# Patient Record
Sex: Female | Born: 1960 | Race: Black or African American | Hispanic: No | Marital: Married | Smoking: Never smoker
Health system: Southern US, Community
[De-identification: ages and names within clinical notes are randomized; demographics above are authoritative.]

## PROBLEM LIST (undated history)

## (undated) DIAGNOSIS — D649 Anemia, unspecified: Secondary | ICD-10-CM

## (undated) DIAGNOSIS — G4730 Sleep apnea, unspecified: Secondary | ICD-10-CM

## (undated) DIAGNOSIS — D219 Benign neoplasm of connective and other soft tissue, unspecified: Secondary | ICD-10-CM

## (undated) DIAGNOSIS — K219 Gastro-esophageal reflux disease without esophagitis: Secondary | ICD-10-CM

## (undated) DIAGNOSIS — E669 Obesity, unspecified: Secondary | ICD-10-CM

## (undated) DIAGNOSIS — M5430 Sciatica, unspecified side: Secondary | ICD-10-CM

## (undated) DIAGNOSIS — K56609 Unspecified intestinal obstruction, unspecified as to partial versus complete obstruction: Secondary | ICD-10-CM

## (undated) DIAGNOSIS — I10 Essential (primary) hypertension: Secondary | ICD-10-CM

## (undated) DIAGNOSIS — K122 Cellulitis and abscess of mouth: Secondary | ICD-10-CM

## (undated) HISTORY — PX: ABDOMINAL HYSTERECTOMY: SHX81

## (undated) HISTORY — PX: REDUCTION MAMMAPLASTY: SUR839

## (undated) HISTORY — DX: Unspecified intestinal obstruction, unspecified as to partial versus complete obstruction: K56.609

## (undated) HISTORY — DX: Sleep apnea, unspecified: G47.30

## (undated) HISTORY — DX: Gastro-esophageal reflux disease without esophagitis: K21.9

## (undated) HISTORY — DX: Cellulitis and abscess of mouth: K12.2

## (undated) HISTORY — PX: BREAST SURGERY: SHX581

## (undated) HISTORY — PX: OTHER SURGICAL HISTORY: SHX169

## (undated) HISTORY — DX: Obesity, unspecified: E66.9

## (undated) HISTORY — DX: Benign neoplasm of connective and other soft tissue, unspecified: D21.9

## (undated) HISTORY — DX: Anemia, unspecified: D64.9

## (undated) HISTORY — DX: Essential (primary) hypertension: I10

---

## 2000-11-08 ENCOUNTER — Ambulatory Visit (HOSPITAL_BASED_OUTPATIENT_CLINIC_OR_DEPARTMENT_OTHER): Admission: RE | Admit: 2000-11-08 | Discharge: 2000-11-09 | Payer: Self-pay | Admitting: Specialist

## 2000-11-08 ENCOUNTER — Encounter (INDEPENDENT_AMBULATORY_CARE_PROVIDER_SITE_OTHER): Payer: Self-pay | Admitting: *Deleted

## 2001-06-23 ENCOUNTER — Other Ambulatory Visit: Admission: RE | Admit: 2001-06-23 | Discharge: 2001-07-05 | Payer: Self-pay | Admitting: Obstetrics and Gynecology

## 2001-06-27 ENCOUNTER — Encounter: Payer: Self-pay | Admitting: Internal Medicine

## 2001-06-27 ENCOUNTER — Ambulatory Visit (HOSPITAL_COMMUNITY): Admission: RE | Admit: 2001-06-27 | Discharge: 2001-06-27 | Payer: Self-pay | Admitting: Internal Medicine

## 2001-10-29 ENCOUNTER — Emergency Department (HOSPITAL_COMMUNITY): Admission: EM | Admit: 2001-10-29 | Discharge: 2001-10-29 | Payer: Self-pay | Admitting: Emergency Medicine

## 2001-11-01 ENCOUNTER — Encounter: Payer: Self-pay | Admitting: Emergency Medicine

## 2001-11-01 ENCOUNTER — Ambulatory Visit (HOSPITAL_COMMUNITY): Admission: RE | Admit: 2001-11-01 | Discharge: 2001-11-01 | Payer: Self-pay | Admitting: Emergency Medicine

## 2002-08-07 ENCOUNTER — Ambulatory Visit (HOSPITAL_COMMUNITY): Admission: RE | Admit: 2002-08-07 | Discharge: 2002-08-07 | Payer: Self-pay | Admitting: Internal Medicine

## 2002-08-07 ENCOUNTER — Encounter: Payer: Self-pay | Admitting: Internal Medicine

## 2003-02-07 ENCOUNTER — Ambulatory Visit (HOSPITAL_COMMUNITY): Admission: RE | Admit: 2003-02-07 | Discharge: 2003-02-07 | Payer: Self-pay | Admitting: Obstetrics and Gynecology

## 2003-02-08 ENCOUNTER — Encounter: Admission: RE | Admit: 2003-02-08 | Discharge: 2003-02-08 | Payer: Self-pay | Admitting: Obstetrics and Gynecology

## 2003-02-08 ENCOUNTER — Encounter (INDEPENDENT_AMBULATORY_CARE_PROVIDER_SITE_OTHER): Payer: Self-pay

## 2003-09-20 ENCOUNTER — Ambulatory Visit (HOSPITAL_COMMUNITY): Admission: RE | Admit: 2003-09-20 | Discharge: 2003-09-20 | Payer: Self-pay | Admitting: *Deleted

## 2003-10-27 ENCOUNTER — Emergency Department (HOSPITAL_COMMUNITY): Admission: EM | Admit: 2003-10-27 | Discharge: 2003-10-27 | Payer: Self-pay | Admitting: *Deleted

## 2003-10-29 ENCOUNTER — Ambulatory Visit (HOSPITAL_COMMUNITY): Admission: RE | Admit: 2003-10-29 | Discharge: 2003-10-29 | Payer: Self-pay | Admitting: Family Medicine

## 2003-11-05 ENCOUNTER — Ambulatory Visit (HOSPITAL_COMMUNITY): Admission: RE | Admit: 2003-11-05 | Discharge: 2003-11-05 | Payer: Self-pay | Admitting: General Surgery

## 2003-12-17 ENCOUNTER — Inpatient Hospital Stay (HOSPITAL_COMMUNITY): Admission: RE | Admit: 2003-12-17 | Discharge: 2003-12-20 | Payer: Self-pay | Admitting: Obstetrics and Gynecology

## 2004-02-06 ENCOUNTER — Ambulatory Visit: Payer: Self-pay | Admitting: Orthopedic Surgery

## 2005-03-09 ENCOUNTER — Encounter: Admission: RE | Admit: 2005-03-09 | Discharge: 2005-03-09 | Payer: Self-pay | Admitting: Obstetrics and Gynecology

## 2005-04-22 ENCOUNTER — Ambulatory Visit: Payer: Self-pay | Admitting: Orthopedic Surgery

## 2005-04-29 ENCOUNTER — Ambulatory Visit (HOSPITAL_COMMUNITY): Admission: RE | Admit: 2005-04-29 | Discharge: 2005-04-29 | Payer: Self-pay | Admitting: Orthopedic Surgery

## 2005-05-07 ENCOUNTER — Ambulatory Visit: Payer: Self-pay | Admitting: Orthopedic Surgery

## 2005-06-04 ENCOUNTER — Ambulatory Visit: Payer: Self-pay | Admitting: Orthopedic Surgery

## 2005-06-24 ENCOUNTER — Encounter: Admission: RE | Admit: 2005-06-24 | Discharge: 2005-06-24 | Payer: Self-pay | Admitting: Orthopedic Surgery

## 2005-08-28 ENCOUNTER — Encounter: Admission: RE | Admit: 2005-08-28 | Discharge: 2005-08-28 | Payer: Self-pay | Admitting: Orthopedic Surgery

## 2006-04-19 ENCOUNTER — Encounter: Admission: RE | Admit: 2006-04-19 | Discharge: 2006-04-19 | Payer: Self-pay | Admitting: Obstetrics and Gynecology

## 2006-04-26 ENCOUNTER — Ambulatory Visit: Payer: Self-pay | Admitting: Orthopedic Surgery

## 2007-04-21 ENCOUNTER — Encounter: Admission: RE | Admit: 2007-04-21 | Discharge: 2007-04-21 | Payer: Self-pay | Admitting: Obstetrics and Gynecology

## 2007-04-26 ENCOUNTER — Encounter: Admission: RE | Admit: 2007-04-26 | Discharge: 2007-04-26 | Payer: Self-pay | Admitting: Family Medicine

## 2007-07-15 ENCOUNTER — Emergency Department (HOSPITAL_COMMUNITY): Admission: EM | Admit: 2007-07-15 | Discharge: 2007-07-16 | Payer: Self-pay | Admitting: Emergency Medicine

## 2007-07-17 ENCOUNTER — Inpatient Hospital Stay (HOSPITAL_COMMUNITY): Admission: EM | Admit: 2007-07-17 | Discharge: 2007-07-27 | Payer: Self-pay | Admitting: Emergency Medicine

## 2008-09-13 ENCOUNTER — Encounter: Admission: RE | Admit: 2008-09-13 | Discharge: 2008-09-13 | Payer: Self-pay | Admitting: Obstetrics and Gynecology

## 2008-12-12 ENCOUNTER — Ambulatory Visit (HOSPITAL_COMMUNITY): Admission: RE | Admit: 2008-12-12 | Discharge: 2008-12-12 | Payer: Self-pay | Admitting: Obstetrics & Gynecology

## 2009-01-18 ENCOUNTER — Ambulatory Visit (HOSPITAL_COMMUNITY): Admission: RE | Admit: 2009-01-18 | Discharge: 2009-01-18 | Payer: Self-pay | Admitting: Obstetrics & Gynecology

## 2010-04-20 ENCOUNTER — Encounter: Payer: Self-pay | Admitting: Orthopedic Surgery

## 2010-05-28 ENCOUNTER — Other Ambulatory Visit: Payer: Self-pay | Admitting: Obstetrics and Gynecology

## 2010-05-28 DIAGNOSIS — Z1231 Encounter for screening mammogram for malignant neoplasm of breast: Secondary | ICD-10-CM

## 2010-06-20 ENCOUNTER — Ambulatory Visit
Admission: RE | Admit: 2010-06-20 | Discharge: 2010-06-20 | Disposition: A | Discharge status: Home / Self Care | Payer: PRIVATE HEALTH INSURANCE | Source: Ambulatory Visit | Attending: Obstetrics and Gynecology | Admitting: Obstetrics and Gynecology

## 2010-06-20 DIAGNOSIS — Z1231 Encounter for screening mammogram for malignant neoplasm of breast: Secondary | ICD-10-CM

## 2010-08-12 NOTE — Group Therapy Note (Signed)
NAMEWENDELL, Susan Quinn NO.:  [id number]   MEDICAL RECORD NO.:  [id number]          PATIENT TYPE:  INP   LOCATION:  A312                          FACILITY:  [facility]   PHYSICIAN:  Skeet Latch, DO    DATE OF BIRTH:  Aug 09, 1960   DATE OF PROCEDURE:  07/18/2007  DATE OF DISCHARGE:                                 PROGRESS NOTE   SUBJECTIVE:  Susan Quinn states that her abdominal pain is slightly  improved today.  Patient did have an episode of  emesis upon awakening  this morning, and briefly had an NG tube in place.  Patient states that  this NG tube irritated her throat and subsequently was pulled.  The  patient had a long discussion with surgery today.  It has been decided  that patient needs to wait a few days to see if her obstruction resolves  on its own before any intervention.  Patient seems okay with this  decision and will follow instructions.   OBJECTIVE:  VITAL SIGNS:  Temperature is 99.1.  Pulse 120.  Blood  pressure 160/93.  Respirations are 20.  CARDIAC:  She is tachycardic.  No rubs, gallops or murmurs.  LUNGS:  Clear to auscultation bilaterally.  No rales, rhonchi or  wheezing.  ABDOMEN:  Soft and mildly distended.  She has slight tenderness on deep  palpation generalized with minimal bowel sounds.  EXTREMITIES:  No clubbing, cyanosis or edema.   LABORATORY DATA:  Sodium 138, potassium 3.6, chloride 100, CO2 - 31,  glucose 136, BUN 17, creatinine 0.83.  Her white count is 6,400.  Hemoglobin 12.5, hematocrit 37.8, platelets 285.   ASSESSMENT/PLAN:  1. Small bowel obstruction.  At this time patient will be on ice      chips.  Will continue this for the next few days to see if patient      has a bowel movement or more flatulence.  Surgery continues to      follow and recommends a few days of bowel rest to see if patient      may need any intervention at this time.  We will continue to      follow.  2. Elevated blood pressure.  Patient is on  clonidine as needed.  We      will increase her dose of clonidine for her elevated blood pressure      at this time.  May need to adjust if she continues to be      tachycardic or if her blood pressure continues to be elevated.  3. Patient will continue to be on deep vein thrombosis as well as      gastrointestinal prophylaxis.      Skeet Latch, DO  Electronically Signed     SM/MEDQ  D:  07/18/2007  T:  07/18/2007  Job:  [id number]

## 2010-08-12 NOTE — Group Therapy Note (Signed)
NAMERION, SCHNITZER NO.:  [id number]   MEDICAL RECORD NO.:  [id number]          PATIENT TYPE:  INP   LOCATION:  A312                          FACILITY:  [facility]   PHYSICIAN:  Skeet Latch, DO    DATE OF BIRTH:  [birth year]   DATE OF PROCEDURE:  07/19/2007  DATE OF DISCHARGE:                                 PROGRESS NOTE   SUBJECTIVE:  Ms. Susan Quinn really has no major change in her condition  today.  The patient is still having some nausea and minimal vomiting.  She still has slight abdominal pain.  The patient was admitted with a  small bowel obstruction and continued to be followed by surgery.  Her  condition has not really improved.  We continue to follow her progress.   OBJECTIVE:  VITAL SIGNS:  Temperature 99.1 degrees, pulse 91,  respirations 20, blood pressure 152/84.  CARDIOVASCULAR:  A regular rate and rhythm.  No murmurs, rubs or  gallops.  LUNGS:  Clear bilaterally.  No rales, rhonchi or wheezing.  ABDOMEN:  Soft, slight tenderness on deep palpation.  No bowel sounds  are appreciated.  No rigidity or guarding.  EXTREMITIES:  No clubbing, cyanosis or edema.   LABORATORY DATA:  Phosphorus 2.8, magnesium 2, sodium 140, potassium  3.2, chloride 105, CO2 of 30, glucose 96, BUN 14, creatinine 0.7.  White  count 5.5, hemoglobin 10.6, hematocrit 32.1, platelet count 226.   ASSESSMENT/PLAN:  1. Partial small bowel obstruction:  The patient just had an NG tube      replaced at this time.  I will see if this improves the patient's      condition.  I had a discussion with surgery.  At this time the      patient's condition is not improving and she will probably need      surgery in the next few days.  I will continue to use conservative      management for the next day or so, and if not the patient will need      to have surgery.  2. Hypertension:  The patient's blood pressure is in a reasonable      range at this time.  Will continue to monitor.  Lastly, the  patient      will continue on GI and deep venous thrombosis prophylaxis.      Skeet Latch, DO  Electronically Signed     SM/MEDQ  D:  07/19/2007  T:  07/19/2007  Job:  [id number]

## 2010-08-12 NOTE — H&P (Signed)
NAMEAYALA, Susan NO.:  [id number]   MEDICAL RECORD NO.:  [id number]          PATIENT TYPE:  INP   LOCATION:  A312                          FACILITY:  [facility]   PHYSICIAN:  Skeet Latch, DO    DATE OF BIRTH:  01-Mar-1961   DATE OF ADMISSION:  07/17/2007  DATE OF DISCHARGE:  LH                              HISTORY & PHYSICAL   CHIEF COMPLAINT:  Abdominal pain with nausea and vomiting.   HISTORY OF PRESENT ILLNESS:  This is a 50 year old African American  female who presents with a 3-day history of abdominal cramping and  nausea.  Patient states since Friday she began to have abdominal  cramping and nausea.  Patient also states she has been having some  vomiting for the last few days and has not gotten any relief.  The  patient states that she came to the emergency room a day prior, was sent  home and then returned with the same symptoms.  The patient does not  have any major medical problems and all she takes is an over-the-counter  Prilosec for reflux.  Patient presented to the ER with the above  complaints.  A CT scan of her abdomen and pelvis done 1 day prior  showed:  1) Mildly dilated distal small bowel looped in the central  pelvis, suspicious for partial bowel obstruction, 2) A small amount of  free fluid in the pelvic cul-de-sac, 3) No focal soft tissue mass or  inflammatory processes identified.  Today she had an abdominal x-ray  performed which showed similar findings, partial small bowel  obstruction.  On exam, patient is stable and sitting in bed.   PAST MEDICAL HISTORY:  Positive for gastroesophageal reflux disease.   CURRENT HOME MEDICATIONS:  1. Prilosec OTC.  2. Phenergan 25 mg as needed.  3. Oxycodone 5/500 mg as needed.   PAST SURGICAL HISTORY:  1. Two C-sections.  2. Breast reduction.  3. Partial hysterectomy.   SOCIAL HISTORY:  1. No history of tobacco or illicit drug use.  2. She states that she drinks alcohol socially.   She  has no known drug allergies.   REVIEW OF SYSTEMS:  GENERAL:  No fever, chills, weight loss, weight  gain.  HEENT:  Unremarkable.  CARDIOVASCULAR:  No palpitations, chest  pain.  RESPIRATORY:  No cough, shortness of breath, wheezing.  GASTROINTESTINAL:  Positive for abdominal pain and cramping, nausea,  vomiting, no diarrhea noted.  GENITOURINARY:  No dysuria, urgency,  frequency.  MUSCULOSKELETAL:  No arthralgias, arthritis, myalgias.  SKIN:  No rashes, abrasions, blisters or bruising.  NEUROLOGIC:  No  lightheadedness, confusion, altered mental status changes.   PHYSICAL EXAM:  VITAL SIGNS:  Temperature is 98.1, pulse 98,  respirations 20, blood pressure 156/92, she is sating 98% on room air.  CONSTITUTIONAL:  Patient is awake and alert, oriented x3.  She is  pleasant, cooperative, no acute distress.  She is well-nourished, well-  hydrated, well-developed.  HEENT:  Head is atraumatic, normocephalic.  Eyes are PERRLA.  EOMI.  NECK:  Soft, supple, nontender, nondistended, no scleral icterus is  noted.  CARDIOVASCULAR:  Regular rate and rhythm.  No murmurs, rubs, gallops.  RESPIRATORY:  Lungs are clear to auscultation bilaterally.  No rales,  rhonchi or wheezing.  ABDOMEN:  Soft.  She has scattered tenderness on deep palpation.  She  has minimal bowel sounds, no rigidity or guarding is noted.  EXTREMITIES:  No clubbing, cyanosis or edema.   LABS:  PTT is 34, PT is 14.2, INR is 1.1.  Sodium 137, potassium 3.4,  chloride 95, CO2 is 32, glucose 137, BUN 14, creatinine 0.82, white  count is 8600.  Hemoglobin 13.1, hematocrit 40.5,  platelet count is  284,000.   RADIOLOGIC STUDIES:  Abdominal x-ray showed partial small bowel  obstruction.  Similar results on CT of her abdomen and pelvis done on  July 16, 2007.   ASSESSMENT:  1. Partial bowel obstruction.  2. History of hypertension.  3. History of gastroesophageal reflux disease.   PLAN:  1. Patient will be admitted to the service  of [facility] to general      medical bed.  2. For her partial bowel obstruction, Surgery has been consulted.      Patient will be kept n.p.o., except for medications at this time.      If patient continues to have nausea and vomiting will need an NG      tube to low intermittent suction.  At this time we will continue      patient with IV pain medication and, as stated, her n.p.o. status.  3. For her hypertension, at this time will just follow her blood      pressure and will give medication as needed.  Patient probably will      need blood pressure medication upon being discharged.  4. For her gastroesophageal reflux disease, patient will be kept on a      PPI, will change it from daily to twice daily at this time.      Patient will be placed also on DVT prophylaxis.      Skeet Latch, DO  Electronically Signed     SM/MEDQ  D:  07/17/2007  T:  07/17/2007  Job:  [id number]

## 2010-08-12 NOTE — Op Note (Signed)
NAMENEREA, BORDENAVE NO.:  [id number]   MEDICAL RECORD NO.:  [id number]          PATIENT TYPE:  INP   LOCATION:  A312                          FACILITY:  [facility]   PHYSICIAN:  Tilford Pillar, MD      DATE OF BIRTH:  Apr 11, 1960   DATE OF PROCEDURE:  07/21/2007  DATE OF DISCHARGE:  07/27/2007                               OPERATIVE REPORT   PREOPERATIVE DIAGNOSIS:  Small bowel obstruction.   POSTOPERATIVE DIAGNOSIS:  Small bowel obstruction (adhesive disease).   OPERATION:  Exploratory laparotomy with extensive lysis of adhesions.   SURGEON:  Tilford Pillar, MD   ANESTHESIA:  General endotracheal.  Local anesthetic none.   SPECIMEN:  None.   ESTIMATED BLOOD LOSS:  Less than 100 mL.   INDICATIONS:  The patient has presented to [facility]  Department with increasing nausea, vomiting, and abdominal pain.  On her  initial workup in the emergency department, it was determined that she  did have a small bowel obstruction.  At this time, she was admitted and  started on initial conservative management as her pain has resolved, her  nausea and vomiting had improved upon placement of a nasogastric tube.  She was continued to be closely monitored and treated conservatively  with IV fluid, bowel rest, serial laboratory, and physical examinations.  At this point, she continued to have diminished bowel function.  Her  nausea was intermittent.  She had pulled two NG tubes during the course  of her management, and at this point continued to have nausea, vomiting,  abdominal pain with no evidence of any bowel function, no flatus, and  still with abdominal distention.  She remained afebrile.  She had a  normal white blood cell count and no evidence of progressing  intraabdominal sepsis.  However, based on her lack of progression and  inability to resolve this conservatively it was discussed at length with  the patient and the patient's husband the risks, benefits,  and  alternatives of exploratory laparotomy with possible bowel resection,  possible ostomy.  The risks including bleeding, infection, bowel injury,  anastomotic leak, as well as persistent ileus as well as possibility of  intraoperative pulmonary or cardiac events were discussed at length with  the patient.  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  I see no evidence of  hernia noted on physical or CT evaluation, as well as no evidence of any  large obstructing masses noted on CT evaluation.  Etiologies were  discussed with the patient but again at this time it was suspected that  this is from adhesive disease.   OPERATION:  The patient was taken to the operating room.  She was placed  in the supine position on the operating table.  At this time, the  general anesthetic was administered.  Once the patient was asleep, she  was endotracheally intubated by anesthesia.  At this point, a  nasogastric tube was replaced by anesthesia and a Foley catheter was  placed using sterile technique by the operating room staff.  Her abdomen  was then prepped and draped in usual fashion.  An infraumbilical skin  incision with a right lateral keyhole defect around the umbilicus was  created with the scalpel and this dissected down to the subcuticular  tissue including the division of the fascia was carried out using  electrocautery.  At this point, the hemostats were utilized to grasp the  peritoneum with this anteriorly.  Digital palpation was conducted to  ensure no bowel was entrapped and a Metzenbaum scissor was used to  create the entry into the peritoneal cavity.  Upon entrance into the  peritoneal cavity, there was some ascitic fluid that was obtained.  This  was clear, did not appear turbid, no foul smelling odor.  At this point,  the peritoneum was further opened superiorly and inferiorly.  Several  loops of dilated small bowel were  encountered as well as multiple  adhesions to the anterior abdominal wall in the inferior aspect of the  pelvis.  This was consistent with adhesions along her previous  Pfannenstiel incision.  At this point, trocar clamps were utilized to  elevate the fascia allowing exposure of the intraabdominal wall.  Careful dissection was carried out with a combination of sharp  Metzenbaum and electrocautery dissection to free the adhesions on the  anterior abdominal wall as well as free several loops of adherent small  bowel.  This dissection continued down into the pelvis where there was  noted to be a solitary loop of small bowel pexed firmly into the pelvis.  With careful dissection, this was freed and brought up to the field.  This was clearly the transition point.  Upon further evaluation, there  was no evidence of any band adhesions around this.  It appeared that  this has just been pexed in position creating the bowel obstruction.  At  this point, an intraluminal contents were easily milked past the area of  obstruction into the distal small bowel.  At this point, I did inspect  the bowel from ligament of Treitz to the terminal ileum.  There was no  evidence of any masses.  No evidence of any ischemic portions of bowel,  and small bowel was viable and noted to have peristalsis throughout.  At  this point, I did palpate the stomach, the NG tube was well positioned.  No other masses or abnormalities were noted in the pelvis, in the right  upper or left upper quadrants on palpation.  At this point, I did  attempt to milk some of the intraluminal contents of the small bowel  proximally towards the stomach.  We did get increased return of enteric  fluid from the nasogastric tube.  At this point, the wound was irrigated  with copious amount of sterile saline.  The bowel was returned to its  intraabdominal position.  Three sheaths of adhesions permitting material  was placed into both the pelvis and  underneath the incisional wound and  then the abdomen was closed.  A 0 loop Novofil x2 was utilized to  reapproximate the fascia.  This was started inferiorly and brought to  the midportion.  A second 0 loop Novofil was started superiorly and was  brought to the first.  These were secured to each other with the tails  being tucked under the subcuticular tissue.  Wound was then again  irrigated and skin staples were utilized to reapproximate the skin  edges.  The skin was washed, dried with moist dry towel.  Telfa dressing  was placed over the staples and 4x4 dressings were placed.  The drapes  were removed.  The dressings were secured.  The patient was allowed to  come out of general anesthetic.  She was transferred back to regular  hospital bed in stable condition.  At the conclusion of the procedure,  all instrument, sponge, and needle counts were correct.  The patient  tolerated the procedure well.      Tilford Pillar, MD  Electronically Signed     BZ/MEDQ  D:  08/04/2007  T:  08/05/2007  Job:  [id number]

## 2010-08-15 NOTE — Op Note (Signed)
NAMEROCSI, HAZELBAKER NO.:  [id number]   MEDICAL RECORD NO.:  [id number]          PATIENT TYPE:  INP   LOCATION:  A425                          FACILITY:  [facility]   PHYSICIAN:  Tilda Burrow, M.D. DATE OF BIRTH:  February 07, 1961   DATE OF PROCEDURE:  DATE OF DISCHARGE:                                 OPERATIVE REPORT   PREOPERATIVE DIAGNOSIS:  Dyspareunia, scheduled for laparoscopic removal of  ovaries.   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   PROCEDURE:  Diagnostic laparoscopy, small bowel enterotomy, conversion to  laparotomy with repair of small bowel enterotomy by Dr. Lovell Sheehan, and  subsequent bilateral salpingo-oophorectomy and lysis of adhesions by Dr. Jannifer Franklin   SURGEON:  Jorene Minors.   ANESTHESIA:  General.   COMPLICATIONS:  Necessity for midline incision to fully access abdominal  cavity to perform bowel repair.   FINDINGS:  Two loops of bowel just below the umbilicus, one of which was  injured during laparoscopic entry, recognized immediately. Extensive pelvic  adhesions with large dilated and tortuous sigmoid colon.   DETAILS OF PROCEDURE:  The patient was taken to the operating room, prepped  and draped in the usual fashion for combined abdominal and vaginal procedure  with legs supported in the yellow-fin low lithotomy leg supports and Foley  catheter in place. The abdomen was prepped and draped, and an infraumbilical  vertical 1-cm skin incision performed through the skin. The patient was  noted to have several upper abdomen puncture sites from prior laparoscopies,  and we were careful to avoid going through an old scar. Veress needle was  used to introduce through the umbilicus, being careful to elevate the  abdominal activity and point the Veress needle towards the pelvis. Water  droplet technique was used to confirm  intraperitoneal location, and  pneumoperitoneum achieved under 8 mmHg 2 to 3 liters CO2 instilled.  Laparoscopic 10-mm camera directed. Trocar was then used to enter the  abdominal cavity. The laparoscope was oriented toward the pelvis and passed  in the peritoneal fat for a distance of approximately 5 cm and was not able  to enter the abdominal cavity on the very first attempt but was reoriented  toward the pelvis and used with laparoscopic guidance and entered what  appeared to be abdomen until the trocar sleeve was emptied and the camera  reinserted. It was apparent that we had perforated into the loop of small  bowel. This was promptly recognized on the very start of the case. We  therefore left the trocar sleeve in place with the camera and prepped for  conversion for laparotomy. Decision was made to proceed with a midline  vertical incision. We made a vertical incision, removing a small ellipse of  redundant skin. We entered through the abdomen. By this time, Dr. Lovell Sheehan  was there having been requested for consultation and assistance. We then  entered the abdominal cavity carefully, removed the laparoscopic trocar,  identified the small bowel injury, and were able to mobilize the small bowel  from its attachments which were quite dense, to the subumbilical area. There  were only 2 loops of bowel parallel to one another in the area below the  umbilicus. There was relatively easy mobilization of the bowel. The lower  pelvis had only some small thin adhesions of omentum. The pelvis itself was  quite adherent, particularly the left adnexa where the sigmoid was densely  adherent against the left pelvic side wall. We proceeded with mobilization  of the small bowel loops, separating them all and then inspecting carefully  to ensure that no through and through injury had occurred. None was  identified. This was consistent with the initial laparoscopic findings. We  were able to close the  bowel in 2 layer closure using 2-0 silk sutures  interrupted fashion placed in 2 layer closure. The pelvis was copiously  irrigated with antibiotic solution including gentamicin containing solution.  The small bowel was mobilized with some thin filmy adhesions encountered at  various spots. The entire bowel could be run. It was noted that there was a  significant amount of omental adhesions into the left lower quadrant. After  Dr. Lovell Sheehan had completed his peritoneal closure, we irrigated the abdomen  carefully, inspected the pelvis, and confirmed that there was relatively  clean pelvis other than the obscured left adnexa. Dr. Lovell Sheehan proceeded with  his own previously planned surgery thereafter.   The procedure was assisted by Kerry Hough, CST-FA. We proceeded to march  down along identifiable cleavage planes, carefully immobilizing the  rectosigmoid from the left pelvic side wall. There were extensive old long  standing adhesions holding the bowel from the side wall. These were easily  taken down once we confined satisfactory cleavage planes. We marched down  the side wall, being careful not to go retroperitoneal and to not injure or  denude the bowel. We eventually identified both adnexal structures, having  pulled away filmy adhesions from both. Once this was done, we were able to  perform bilateral salpingo-oophorectomy.   Right salpingo-oophorectomy was performed by grasping a readily identifiable  quiescent right ovary with Babcock clamp, mobilizing any small adhesions  from the right adnexa and gradually  freeing up the adnexal structures.  These could be dissected off the side wall with careful identification of  the right ureter, and we were able to take off the right tube and ovary with  identifying and isolating the infundibulopelvic ligament well away from the  ureter, cross clamping, and then peeling the ovary from its imbedded  position in the right pelvic side  wall.  Then we proceeded with the left salpingo-oophorectomy which first required  extensive dissection from the sigmoid to mobilize it off of the left pelvic  side wall. Distal sigmoid was also attached to the top of the vaginal cuff,  and it was felt that the bowel adhesions were a significant complicating  factor of the patient's long standing chronic constipation. We were able to  gradually mobilize the rectosigmoid and identify thin quiescent ovary stuck  to the side of the pelvis and the sigmoid. The colon was taken down from its  adhesions over the left side of the pelvis including vaginal apex. We were  able to identify the ureter down the left side wall and identify the left  infundibulopelvic ligament separate and distinct from the left ureter. We  had to trace the left ureter down all the way along the side wall to be sure  that there was adequate access. We were able  to take off the left tube and  ovary without compromising the ureter, though we were within 5 mm at times  in our dissection.   Right adnexa was similarly taken care of. Then we proceeded to irrigate the  pelvis copiously, inspect for any adhesions, free up the bowel completely so  that relaxed repositioning of the bowel could occur. We then proceeded with  irrigation of the abdomen, closure of the anterior peritoneum with 2-0  chromic, placement of 0 Vicryl closure of the fascia, placement of subcu  Jackson-Pratt drain, and 2-0 plain closure of the subcu fatty tissue and  staple closure of the skin with estimated blood loss 150 cc. The patient  tolerated the procedure well and went to recovery room in good condition.     John   JVF/MEDQ  D:  12/17/2003  T:  12/18/2003  Job:  [id number]

## 2010-08-15 NOTE — H&P (Signed)
NAMESHARLENA, KRISTENSEN NO.:  [id number]   MEDICAL RECORD NO.:  [id number]          PATIENT TYPE:  AMB   LOCATION:  DAY                           FACILITY:  [facility]   PHYSICIAN:  Tilda Burrow, M.D. DATE OF BIRTH:  07/08/1960   DATE OF ADMISSION:  12/17/2003  DATE OF DISCHARGE:  LH                                HISTORY & PHYSICAL   ADMITTING DIAGNOSES:  Uterine fibroids 12 weeks size, menorrhagia, anemia  secondary to menorrhagia, obesity with abdominal wall laxity requesting  panniculectomy.   HISTORY OF PRESENT ILLNESS:  This 50 year old female has been followed  through our office for various GYN concerns since December 2004.  She is  admitted at this time for abdominal hysterectomy and panniculectomy with  planned preservation of ovaries, with planned removal of cervix.  Liliauna  has been seen in our office with documented anemia with hematocrit of 27.5  despite efforts at iron therapy.  She is normally cared for by Dr. Assunta Found of [facility].  Preoperative evaluations included  evaluation for chest discomfort which has included an echocardiogram which  was normal with ejection fraction of 61%.  She had exercise intolerance  suggesting deconditioning.  Ms. Link requests panniculectomy as a portion  of the procedure, this has been discussed with her and she has been referred  to plastic surgeon, this would be not an option for the patient due to cost  for an uncovered service.  She requests panniculectomy as a portion of the  procedure.   Hysterectomy is to consist of removal of uterus estimated approximately 300  g size based on ultrasound measurements of 10.9 x 6.5 x 8.4 cm with numerous  fibroids including lower uterine segment fibroid noted as well as a  pedunculated left uterine fundus fibroid.  Risks of surgery have been  reviewed with patient including increased risk related with increased  incision size.  She is aware that  drains are planned and will be left in  place after she is discharged home for time frame to be determined based on  clinical course.   PAST MEDICAL HISTORY:  Positive for gastroesophageal reflux.  She has had a  cardiac workup in August 2005 by Dr. Kem Boroughs documenting improvement  in symptoms when being placed on Aciphex and a negative cardiac echo with  61% ejection fraction.   PAST SURGICAL HISTORY:  Surgical history notable for reduction mammoplasty  many years ago in [place], patient has had suboptimal results from her  reduction mammoplasty due to what is supposedly shifting in the residual  tissue to result in atypical nipple placement.  C-section.   ALLERGIES:  None.   MEDICATIONS:  Aciphex, iron tablets earlier this Fall, Vicodin p.r.n. pelvic  discomfort.   PHYSICAL EXAMINATION:  GENERAL:  Healthy cheerful African-American female.  VITAL SIGNS:  Height 5 feet 5 inches, weight 237.  EYES:  Pupils equal, round, reactive.  NECK:  Neck supple.  Trachea midline.  CHEST:  Clear to auscultation.  CARDIAC:  Regular rate and rhythm without murmurs.  BREAST:  Well-healed surgical scars with evidence of  prior reduction  mammoplasty.  ABDOMEN:  Moderate obesity with significant protuberance of the fatty tissue  below the umbilicus.  She has a well-healed surgical scar from prior  cesarean section.  GU:  Pelvic exam shows good support.  Cervix is normal.  Uterus is  anteflexed.  Adnexa negative for masses.  GC and Chlamydia cultures  negative.  Prior Pap smears through [facility].  EXTREMITIES:  Grossly normal without cyanosis, clubbing, or edema.   PLAN:  Abdominal hysterectomy/panniculectomy on December 17, 2003.     John   JVF/MEDQ  D:  12/16/2003  T:  12/16/2003  Job:  [id number]   cc:   [facility]   [facility]  Fax: [phone]

## 2010-08-15 NOTE — Discharge Summary (Signed)
Susan Quinn, ATAYDE NO.:  [id number]   MEDICAL RECORD NO.:  [id number]          PATIENT TYPE:  INP   LOCATION:  A312                          FACILITY:  [facility]   PHYSICIAN:  Tilford Pillar, MD      DATE OF BIRTH:  [birth year]   DATE OF ADMISSION:  07/17/2007  DATE OF DISCHARGE:  04/29/2009LH                               DISCHARGE SUMMARY   ADMISSION DIAGNOSIS:  Small bowel obstruction.   DISCHARGE DIAGNOSIS:  Status post lysis of adhesions.   PROCEDURES:  Exploratory laparotomy with lysis of adhesions on July 21, 2007.   ADMITTING SURGEON:  Tilford Pillar, MD   DISPOSITION:  Home.   BRIEF H&P:  Please see the admission history and physical for the  complete H&P.  The patient presented to [facility] Emergency  Department with increasing nausea, vomiting, and abdominal pain.  She  was initially evaluated and was found to have small bowel obstruction.  She was admitted on July 17, 2007 for continued management and  intervention.   HOSPITAL COURSE:  The patient was admitted as above.  Initial attempts  at conservative managements were unsuccessful and on July 21, 2007, the  patient continued to have no bowel function, continued to have nausea  and vomiting despite adequate IV resuscitation and nasogastric tube  compression.  Bowel function did not return at this point, so she was  taken to the operating room for exploratory laparotomy and lysis of  adhesions.  She tolerated the procedure well.  There was a single-loop  transition point noted on the small bowel.  There was no evidence of any  band adhesions around this portion of the bowel.  No resection was  required.  She tolerated the procedure well.  Postoperative course was  somewhat slow secondarily to a persistent postoperative ileus where she  did slowly begin to have flatus, increase in bowel function, and  diminishing nausea and vomiting.  She did have some low-grade  temperatures  associated with this, had atelectasis with initial limited  mobility, however, with increased activity and encouragement of  incentive spirometry usage, this quickly improved.  On July 27, 2007,  the patient was tolerating a regular diet.  She was ambulatory.  The  pain was controlled on oral pain medicine.  Plans were made for  discharge to home.   DISCHARGE INSTRUCTIONS:  The patient is to continue local wound care.  She is to continue activity as tolerated.  She is to not lift anything  greater than 20 pounds for the next 4 weeks.  She may resume the diet as  tolerated.  She is to follow up in my office in 2 weeks for staple  removal.   DISCHARGE MEDICATIONS:  1. The patient is to continue all previously prescribed home      medications.  2. Lortab 5/500 mg 1-2 p.o. q.4 h. p.r.n. pain.      Tilford Pillar, MD  Electronically Signed     BZ/MEDQ  D:  08/04/2007  T:  08/05/2007  Job:  [id number]

## 2010-08-15 NOTE — Op Note (Signed)
[place]. [facility]  Patient:    Susan Quinn, Susan Quinn              MRN: [id number] Proc. Date: 11/08/00 Adm. Date:  [id number] Attending:  Gustavus Messing                           Operative Report  INDICATION:  This is a 50 year old lady with severe, severe macromastia, back and shoulder pain secondary to her large pendulous breasts.  She has also increased accessory breast tissue, the right side greater than the left, with tissue traversing around the right axilla, right latissimus dorsi area and serratus anterior areas.  PROCEDURES PLANNED:  Bilateral breast reductions with the excision of accessory breast tissue using the inferior pedicle technique.  SURGEON:  Aundra Dubin, M.D.  ASSISTANT:  Margaretha Sheffield, R.N.  DESCRIPTION OF PROCEDURE:  Preoperatively, the patient was sat up and drawn for the reduction mammoplasty using the inferior pedicle technique.  She then underwent general anesthesia and intubated orally.  Prep was done to the chest and breast areas in the routine fashion using Betadine soaping solution and walled off with sterile towels and draped so as to make a sterile field.  The areas were injected with Xylocaine 0.25% with epinephrine 1:400,000 concentration, a total of 100 cc per side.  The wounds were scored with #15 blades and the skin over the inferior pedicles was de-epithelialized with the #20 blades.  The medial and lateral fatty dermal pedicles were incised down to underlying pectoralis fascia and excised out.  Next, after proper hemostasis, the new keyhole areas were also debulked.  Accessory breast tissue was removed from the latissimus dorsi area, serratus anterior and upper axillary regions using direct excision as well as liposuction assistance with [place] catheters 28-3 and -4s.  After this was completed, the new keyhole areas were debulked.  Flaps were transposed and stayed with 3-0 Prolene. Subcutaneous  closure was done with 3-0 Monocryl x 2 layers and running subcuticular stitches of 3-0 Monocryl to the areas.  The wounds were cleansed, sterile dressings applied including Xeroform, 4 x 4s, Steri-Strips, ABDs and Hypafix.  She withstood the procedures very well.  Estimated blood loss was less than 100 cc.  Complications:  None. DD:  11/08/00 TD:  11/08/00 Job: [id number]

## 2010-08-15 NOTE — Op Note (Signed)
Susan Quinn, Susan Quinn NO.:  [id number]   MEDICAL RECORD NO.:  [id number]          PATIENT TYPE:  INP   LOCATION:  A425                          FACILITY:  [facility]   PHYSICIAN:  Tilda Burrow, M.D. DATE OF BIRTH:  July 19, 1960   DATE OF PROCEDURE:  12/17/2003  DATE OF DISCHARGE:  12/20/2003                                 OPERATIVE REPORT   PREOPERATIVE DIAGNOSES:  1.  Uterine fibroids.  2.  Menorrhagia.  3.  Anemia.  4.  Obesity with panniculus.   POSTOPERATIVE DIAGNOSES:  1.  Uterine fibroids.  2.  Menorrhagia.  3.  Anemia.  4.  Obesity with panniculus.   PROCEDURE:  Total abdominal hysterectomy, lysis of adhesions,  panniculectomy.   SURGEON:  Tilda Burrow, M.D.   ASSISTANT:  Talbert Forest.   ANESTHESIA:  General.   COMPLICATIONS:  None.   ESTIMATED BLOOD LOSS:  500 cc.   FINDINGS:  Dense adhesions from uterine fundus to left lower quadrant of  abdomen.  Obesity with thick subcutaneous fatty space and abdominal wall  laxity.   DRAINS:  Two subcutaneous drain, one drain to the space of Retzius.   SPECIMENS:  Uterus, cervix, abdominal pannus.   OPERATIVE TIME:  2 hours and 24 minutes.   DESCRIPTION OF PROCEDURE:  The patient was taken to the operating room and  prepped and draped for lower abdominal surgery, with vaginal prep performed  and Foley catheter in place. The lower abdomen was marked as previously  agreed to by the patient, removing a 60-cm long x 10-cm wide ellipse of skin  and subcutaneous fatty tissue.  The lateral aspects were reapproximated with  2-0 plain and stapled closed, leaving the middle 30 cm open.   We then made a Pelosi-type incision with midline incision from just below  the umbilicus to the suprapubic area, easily entering the abdominal cavity.  There were some omental adhesions to the anterior abdominal wall, and these  were taken down carefully.  The uterus was found to have some adhesions to  the anterior  abdominal wall.  The round ligaments could be identified and  were taken down bilaterally.  There was a pedunculated posterior fibroid on  the right side.  The ovaries appeared grossly normal on either side.  There  was a cyst of the left ovary that was ruptured spontaneously during  manipulation.   The left utero-ovarian ligament was isolated, clamped, cut, and suture  ligated, leaving the ovary in place.  The right side was treated similarly.  The dense fundal adhesions to the anterior abdominal wall required extensive  careful dissection to remove this without getting into the bladder.  We  gradually were able to take these down and ligate it across the adhesion  stump on both sides.  Once the fundal adhesions were taken down, the bladder  flap area was identified and was much easier to deal with, and the bladder  was taken down.  We them marched down the sides, clamping, cutting, and  suture ligating with 0 chromic, marching down the cardinal ligaments on  either side, reaching the  vaginal cuff.  An anterior stab incision was made  in the cervicovaginal fornix.  We amputated the cervix and uterus off of the  vaginal cuff.  We closed the vaginal cuff with Aldridge stitches at each  lateral vaginal angle and then figure-of-eight sutures to close the vaginal  cuff.  The patient tolerated this portion of the procedure quite well.  Irrigation was performed.  The patient had diffuse oozing from the area  where all the adhesions had been attached to the bladder, but at no point  were we suspicious of bladder perforation.  Point cautery was used when it  could be safely performed.  A few Endoclips were necessary.  At the end of  the surgical procedure, hemostasis was deemed as adequate.  The pelvis was  irrigated.  A drain was placed in the space of Retzius with the tip of it  reaching into the pelvis, and then closure began.  The peritoneum was closed  as much as possible with 2-0 chromic,  and then the midline incision was  closed with Monocryl.  The subcutaneous tissues were reapproximated with  interrupted 2-0 plain sutures.  Two flat JP drains were allowed to exit  above the incision near the midline, and staple closure of the skin  completed the procedure.  Estimated blood loss was 500 cc.     John   JVF/MEDQ  D:  12/20/2003  T:  12/20/2003  Job:  [id number]

## 2010-08-15 NOTE — Discharge Summary (Signed)
NAMETALLYN, HOLROYD NO.:  [id number]   MEDICAL RECORD NO.:  [id number]          PATIENT TYPE:  INP   LOCATION:  A425                          FACILITY:  [facility]   PHYSICIAN:  Tilda Burrow, M.D. DATE OF BIRTH:  06-27-1960   DATE OF ADMISSION:  12/17/2003  DATE OF DISCHARGE:  09/22/2005LH                                 DISCHARGE SUMMARY   ADMISSION DIAGNOSES:  1.  Uterine fibroids, 12 weeks size.  2.  Menorrhagia.  3.  Anemia.  4.  Abdominal wall obesity with laxity and requested panniculectomy.   DISCHARGE DIAGNOSIS:  1.  Uterine fibroids, 12 weeks size.  2.  Menorrhagia.  3.  Anemia.  4.  Obesity with abdominal wall laxity.   PROCEDURE:  Total abdominal hysterectomy, lysis of adhesions, and  panniculectomy on December 17, 2003.   DISCHARGE MEDICATIONS:  1.  Tylox one to two p.o. q.4h. p.r.n. pain, dispense 30.  2.  Motrin 400 mg p.o. q.4h. p.r.n. mild pain.  3.  Cipro 500 mg b.i.d. x5 days.   FOLLOW UP:  One week for drain removal and incision check.   HISTORY OF PRESENT ILLNESS:  This 50 year old female was admitted for a  hysterectomy and panniculectomy after referral from [facility].  She had an estimated 300 g sized uterus, had chronic anemia  secondary to heavy menstrual flow.  She had a cardiac workup in August 2005,  with 61% ejection fraction and chest discomfort found to be related to GERD.   HOSPITAL COURSE:  The patient was admitted, underwent hysterectomy and  panniculectomy, removing an approximately 300 g uterus and a 60 cm long x 10  cm wide ellipse of skin and some fatty tissue.  The procedure was notable  for some extensive adhesions with dense adhesions as large as 1 cm in  diameter attaching the uterus fundus to the anterior abdominal wall, coming  from the old cesarean section scars.  She had two subcutaneous drains, and a  drain to the space of Retzius,due to persistent oozing.   Hematocrit remained nicely  stable.  She had a hematocrit of 29  preoperatively, 30 in the postoperative, and equilibrated to 26.1 on  postoperative day #1.  She had an uneventful postoperative course and was  discharged on Thursday, December 20, 2003, postoperative day #3, with above  listed medications for followup in one week.     John   JVF/MEDQ  D:  12/20/2003  T:  12/20/2003  Job:  [id number]   cc:   [facility]   Corrie Mckusick, M.D.  Fax: [phone]

## 2010-12-23 LAB — COMPREHENSIVE METABOLIC PANEL
ALT: 18
AST: 16
Albumin: 3.8
CO2: 32
Calcium: 10.1
Chloride: 95 — ABNORMAL LOW
Creatinine, Ser: 0.82
GFR calc Af Amer: >60
GFR calc non Af Amer: >60
Sodium: 137
Total Bilirubin: 0.6

## 2010-12-23 LAB — BASIC METABOLIC PANEL
BUN: 10
BUN: 14
BUN: 7
BUN: 8
BUN: 8
CO2: 28
CO2: 28
CO2: 30
CO2: 30
CO2: 31
Calcium: 8.4
Calcium: 8.4
Calcium: 8.6
Calcium: 8.8
Calcium: 8.8
Calcium: 8.9
Chloride: 100
Chloride: 101
Chloride: 101
Chloride: 93 — ABNORMAL LOW
Chloride: 97
Creatinine, Ser: 0.63
Creatinine, Ser: 0.64
Creatinine, Ser: 0.7
Creatinine, Ser: 0.72
Creatinine, Ser: 0.72
GFR calc Af Amer: >60
GFR calc Af Amer: >60
GFR calc Af Amer: >60
GFR calc Af Amer: >60
GFR calc Af Amer: >60
GFR calc non Af Amer: >60
GFR calc non Af Amer: >60
GFR calc non Af Amer: >60
Glucose, Bld: 105 — ABNORMAL HIGH
Glucose, Bld: 110 — ABNORMAL HIGH
Glucose, Bld: 115 — ABNORMAL HIGH
Glucose, Bld: 126 — ABNORMAL HIGH
Glucose, Bld: 136 — ABNORMAL HIGH
Glucose, Bld: 96
Potassium: 3.2 — ABNORMAL LOW
Potassium: 3.3 — ABNORMAL LOW
Potassium: 3.8
Sodium: 133 — ABNORMAL LOW
Sodium: 135
Sodium: 136
Sodium: 137
Sodium: 138
Sodium: 140

## 2010-12-23 LAB — CBC
HCT: 30.3 — ABNORMAL LOW
HCT: 32.1 — ABNORMAL LOW
HCT: 33.6 — ABNORMAL LOW
HCT: 35 — ABNORMAL LOW
HCT: 35.2 — ABNORMAL LOW
HCT: 37.8
Hemoglobin: 11.2 — ABNORMAL LOW
Hemoglobin: 11.6 — ABNORMAL LOW
Hemoglobin: 12.5
MCHC: 32.8
MCHC: 32.9
MCHC: 33.3
MCHC: 33.4
MCHC: 33.5
MCV: 71.5 — ABNORMAL LOW
MCV: 71.8 — ABNORMAL LOW
MCV: 71.9 — ABNORMAL LOW
MCV: 71.9 — ABNORMAL LOW
MCV: 72.5 — ABNORMAL LOW
Platelets: 215
Platelets: 221
Platelets: 226
Platelets: 284
Platelets: 299
Platelets: 325
RBC: 4.21
RBC: 4.34
RBC: 4.5
RBC: 4.86
RBC: 5.22 — ABNORMAL HIGH
RBC: 5.65 — ABNORMAL HIGH
RDW: 14.8
RDW: 15
RDW: 15.1
RDW: 15.2
RDW: 15.2
RDW: 15.2
RDW: 15.4
WBC: 2.7 — ABNORMAL LOW
WBC: 4
WBC: 5.3
WBC: 8.6

## 2010-12-23 LAB — DIFFERENTIAL
Basophils Absolute: 0
Basophils Absolute: 0
Basophils Absolute: 0
Basophils Absolute: 0
Basophils Absolute: 0
Basophils Absolute: 0
Basophils Absolute: 0
Basophils Absolute: 0
Basophils Relative: 0
Basophils Relative: 0
Basophils Relative: 0
Basophils Relative: 0
Basophils Relative: 0
Basophils Relative: 0
Eosinophils Absolute: 0
Eosinophils Absolute: 0
Eosinophils Absolute: 0
Eosinophils Absolute: 0
Eosinophils Absolute: 0
Eosinophils Absolute: 0
Eosinophils Absolute: 0.1
Eosinophils Absolute: 0.1
Eosinophils Absolute: 0.1
Eosinophils Relative: 0
Eosinophils Relative: 0
Eosinophils Relative: 1
Eosinophils Relative: 1
Eosinophils Relative: 1
Lymphocytes Relative: 15
Lymphocytes Relative: 17
Lymphocytes Relative: 18
Lymphocytes Relative: 23
Lymphocytes Relative: 8 — ABNORMAL LOW
Lymphs Abs: 0.6 — ABNORMAL LOW
Lymphs Abs: 0.7
Lymphs Abs: 1.4
Lymphs Abs: 1.5
Monocytes Absolute: 0.3
Monocytes Absolute: 0.4
Monocytes Absolute: 0.4
Monocytes Absolute: 0.5
Monocytes Absolute: 0.6
Monocytes Relative: 12
Monocytes Relative: 13 — ABNORMAL HIGH
Monocytes Relative: 3
Monocytes Relative: 7
Monocytes Relative: 8
Neutro Abs: 1.8
Neutro Abs: 2.2
Neutro Abs: 2.9
Neutro Abs: 3
Neutro Abs: 3.1
Neutro Abs: 4.3
Neutrophils Relative %: 65
Neutrophils Relative %: 66
Neutrophils Relative %: 72
Neutrophils Relative %: 74
Neutrophils Relative %: 74

## 2010-12-23 LAB — HEPATIC FUNCTION PANEL
Bilirubin, Direct: <0.1
Total Bilirubin: 0.6

## 2010-12-23 LAB — URINALYSIS, ROUTINE W REFLEX MICROSCOPIC
Glucose, UA: NEGATIVE
Glucose, UA: NEGATIVE
Hgb urine dipstick: NEGATIVE
Leukocytes, UA: NEGATIVE
Nitrite: NEGATIVE
Protein, ur: 30 — AB
Protein, ur: NEGATIVE
Specific Gravity, Urine: 1.02
Urobilinogen, UA: 0.2
pH: 6

## 2010-12-23 LAB — PHOSPHORUS
Phosphorus: 2.8
Phosphorus: 2.9
Phosphorus: 3.3
Phosphorus: 3.4

## 2010-12-23 LAB — URINE CULTURE: Colony Count: 30000

## 2010-12-23 LAB — MAGNESIUM
Magnesium: 2
Magnesium: 2
Magnesium: 2.2

## 2010-12-23 LAB — PROTIME-INR: Prothrombin Time: 14.2

## 2010-12-23 LAB — URINE MICROSCOPIC-ADD ON

## 2011-06-29 ENCOUNTER — Other Ambulatory Visit: Payer: Self-pay | Admitting: Obstetrics and Gynecology

## 2011-06-29 DIAGNOSIS — Z1231 Encounter for screening mammogram for malignant neoplasm of breast: Secondary | ICD-10-CM

## 2011-06-30 ENCOUNTER — Ambulatory Visit: Payer: PRIVATE HEALTH INSURANCE

## 2011-07-06 ENCOUNTER — Ambulatory Visit
Admission: RE | Admit: 2011-07-06 | Discharge: 2011-07-06 | Disposition: A | Discharge status: Home / Self Care | Payer: PRIVATE HEALTH INSURANCE | Source: Ambulatory Visit | Attending: Obstetrics and Gynecology | Admitting: Obstetrics and Gynecology

## 2011-07-06 DIAGNOSIS — Z1231 Encounter for screening mammogram for malignant neoplasm of breast: Secondary | ICD-10-CM

## 2012-03-30 DIAGNOSIS — K122 Cellulitis and abscess of mouth: Secondary | ICD-10-CM

## 2012-03-30 HISTORY — DX: Cellulitis and abscess of mouth: K12.2

## 2012-06-30 ENCOUNTER — Ambulatory Visit (INDEPENDENT_AMBULATORY_CARE_PROVIDER_SITE_OTHER): Payer: PRIVATE HEALTH INSURANCE | Admitting: Adult Health

## 2012-06-30 ENCOUNTER — Telehealth: Payer: Self-pay | Admitting: Adult Health

## 2012-06-30 ENCOUNTER — Encounter: Payer: Self-pay | Admitting: Adult Health

## 2012-06-30 VITALS — BP 122/86 | HR 80 | Ht 65.0 in | Wt 256.0 lb

## 2012-06-30 DIAGNOSIS — I10 Essential (primary) hypertension: Secondary | ICD-10-CM | POA: Insufficient documentation

## 2012-06-30 DIAGNOSIS — IMO0001 Reserved for inherently not codable concepts without codable children: Secondary | ICD-10-CM

## 2012-06-30 DIAGNOSIS — K05219 Aggressive periodontitis, localized, unspecified severity: Secondary | ICD-10-CM

## 2012-06-30 DIAGNOSIS — K0520 Aggressive periodontitis, unspecified: Secondary | ICD-10-CM

## 2012-06-30 DIAGNOSIS — E669 Obesity, unspecified: Secondary | ICD-10-CM

## 2012-06-30 DIAGNOSIS — K219 Gastro-esophageal reflux disease without esophagitis: Secondary | ICD-10-CM

## 2012-06-30 HISTORY — DX: Obesity, unspecified: E66.9

## 2012-06-30 HISTORY — DX: Reserved for inherently not codable concepts without codable children: IMO0001

## 2012-06-30 MED ORDER — AMOXICILLIN 500 MG PO CAPS: 500.0000 mg | ORAL_CAPSULE | Freq: Three times a day (TID) | ORAL | Status: DC

## 2012-06-30 NOTE — Progress Notes (Signed)
Subjective:     Patient ID: Susan Quinn, female   DOB: 25-Sep-1960, 52 y.o.   MRN: [id number]  HPI Susan Quinn is a 52 year old black female, in to have her blood pressure checked she said it was 160/90 at work this am. She is having numbness in top gum and her cheek hurts, she has noticed some swelling in her ankles.   Review of SystemsCarolyn denies any headache or blurred vision. She is complaining of numbness of her top gum and right cheek hurts.She complains of swelling in her ankles, she is having some nite sweats and complains of weight gain. Reviewed past medical, surgical, social and family history in epic. Reviewed medications and allergies.     Objective:   Physical ExamSkin warm and dry. No maxillary, frontal or ethmoid sinus tenderness. No oral lesions, nasal passages are normal. She does exhibit tenderness to the right of her right nares, and there is tenderness on the top gum above her teeth. She recently had an x-ray of her teeth. No recent oral procedures. Cardiovascular: regular rate and rhythm. Lungs: clear auscultation bilaterally. Extremities: Mild swelling non pitting,nontender with good pedal pulses. Dr. Despina Quinn in to co-examine gum    Assessment:     Hypertension Abscess of upper gum   Obesity  Plan:      Rx. Amoxicillin 500mg  tid x 10 days Decrease salt and try lemon water Continue HCTZ and check BP at work next week Call dentist Number given to Dr. Johna Quinn for her to call and discuss Gastric sleeve surgery as she desires weight loss.

## 2012-06-30 NOTE — Patient Instructions (Addendum)
Take amoxicillin as directed. Call dentist Decrease salt, try lemon water RTC in 2 weeks in follow up Sign for my chart Check BP at week and record

## 2012-06-30 NOTE — Telephone Encounter (Signed)
Pt. Complains of BP 160/90 sinus symptoms, facial sore, gums feel numb, and has crawling sensation also swelling in feet and legs. To come in at 4pm today.

## 2012-07-13 ENCOUNTER — Encounter: Payer: Self-pay | Admitting: *Deleted

## 2012-07-14 ENCOUNTER — Ambulatory Visit: Payer: PRIVATE HEALTH INSURANCE | Admitting: Adult Health

## 2012-07-25 ENCOUNTER — Ambulatory Visit (INDEPENDENT_AMBULATORY_CARE_PROVIDER_SITE_OTHER): Payer: PRIVATE HEALTH INSURANCE | Admitting: Adult Health

## 2012-07-25 ENCOUNTER — Encounter: Payer: Self-pay | Admitting: Adult Health

## 2012-07-25 VITALS — BP 126/72 | HR 76 | Ht 64.0 in | Wt 256.0 lb

## 2012-07-25 DIAGNOSIS — Z01419 Encounter for gynecological examination (general) (routine) without abnormal findings: Secondary | ICD-10-CM

## 2012-07-25 DIAGNOSIS — Z1212 Encounter for screening for malignant neoplasm of rectum: Secondary | ICD-10-CM

## 2012-07-25 DIAGNOSIS — E669 Obesity, unspecified: Secondary | ICD-10-CM

## 2012-07-25 DIAGNOSIS — I10 Essential (primary) hypertension: Secondary | ICD-10-CM

## 2012-07-25 LAB — HEMOCCULT GUIAC POC 1CARD (OFFICE): Fecal Occult Blood, POC: NEGATIVE

## 2012-07-25 MED ORDER — PHENTERMINE HCL 37.5 MG PO CAPS: 37.5000 mg | ORAL_CAPSULE | ORAL | Status: DC

## 2012-07-25 NOTE — Patient Instructions (Addendum)
Decrease  Carbs, increase walking Trial adipex Follow up in 4 weeks for weight check Mammogram yearly  Colonoscopy advise pt to call Sign up for my chart

## 2012-07-25 NOTE — Progress Notes (Signed)
Patient ID: Susan Quinn, female   DOB: 1960-08-30, 52 y.o.   MRN: [id number] History of Present Illness: Susan Quinn is a 52 year old black female in for a physical exam.She saw her dentist after last visit when she was treated for gum abscess and she needs 4 root canals.She voices she needs help with weight loss, she is exercising and has a life coach at work.She went to hear about gastric surgery and didn't think she could do it.  Current Medications, Allergies, Past Medical History, Past Surgical History, Family History and Social History were reviewed in [employer] record.    Review of Systems: Patient denies any headaches, blurred vision, shortness of breath, chest pain, abdominal pain, problems with bowel movements, urination, or intercourse. No joint swelling or mood changes.  Physical Exam: Blood pressure 126/72, pulse 76, height 5\' 4"  (1.626 m), weight 256 lb (116.121 kg). General:  Well developed, well nourished, no acute distress Skin:  Warm and dry Neck:  Midline trachea, normal thyroid Lungs; Clear to auscultation bilaterally Breast:  No dominant palpable mass, retraction, or nipple discharge, has scars sp. Breast reduction Cardiovascular: Regular rate and rhythm Abdomen:  Soft, non tender, no hepatosplenomegaly Pelvic:  External genitalia is normal in appearance.  The vagina is normal in appearance. The cervix and uterus are absent.  No adnexal masses or tenderness noted. Rectal: Good sphincter tone, no polyps, or hemorrhoids felt.  Hemoccult negative. Extremities:  No swelling or varicosities noted Psych:  Alert and cooperative and in good mood.   Impression: Yearly gyn exam History of hypertension Obesity History gum abscess  Plan: Trial of adipex Decrease carbs and increase walking Follow up with weight check in 4 weeks Mammogram yearly Colonoscopy advised-pt to call to schedule Labs at work

## 2012-08-23 ENCOUNTER — Encounter: Payer: Self-pay | Admitting: Adult Health

## 2012-08-23 ENCOUNTER — Ambulatory Visit (INDEPENDENT_AMBULATORY_CARE_PROVIDER_SITE_OTHER): Payer: PRIVATE HEALTH INSURANCE | Admitting: Adult Health

## 2012-08-23 VITALS — BP 120/76 | Ht 65.0 in | Wt 249.5 lb

## 2012-08-23 DIAGNOSIS — E669 Obesity, unspecified: Secondary | ICD-10-CM

## 2012-08-23 MED ORDER — PHENTERMINE HCL 37.5 MG PO CAPS: 37.5000 mg | ORAL_CAPSULE | ORAL | Status: DC

## 2012-08-23 NOTE — Progress Notes (Signed)
Subjective:     Patient ID: Susan Quinn, female   DOB: May 07, 1960, 52 y.o.   MRN: [id number]  HPI Susan Quinn is back for a weight and BP check.She has lost 7 pounds and feels good.  Review of Systems No complaints  She did have 3 root canal since I saw her last.  Reviewed past medical,surgical, social and family history. Reviewed medications and allergies.    Objective:   Physical Exam Blood pressure 120/76, height 5\' 5"  (1.651 m), weight 249 lb 8 oz (113.172 kg).   Skin warm and dry.  Lungs: clear to ausculation bilaterally. Cardiovascular: regular rate and rhythm. Assessment:      Obesity Weight management    Plan:      Continue with weight loss efforts Rx adipex 37.5 mg 1 daily with no refills Follow up in 4 weeks for weight and BP check

## 2012-08-23 NOTE — Patient Instructions (Addendum)
Continue weight loss efforts and follow up in 4 weeks

## 2012-09-20 ENCOUNTER — Ambulatory Visit: Payer: PRIVATE HEALTH INSURANCE | Admitting: Adult Health

## 2012-10-10 ENCOUNTER — Other Ambulatory Visit: Payer: Self-pay | Admitting: Adult Health

## 2012-11-14 ENCOUNTER — Other Ambulatory Visit: Payer: Self-pay | Admitting: Adult Health

## 2013-02-09 ENCOUNTER — Other Ambulatory Visit: Payer: Self-pay | Admitting: Adult Health

## 2013-05-08 ENCOUNTER — Other Ambulatory Visit: Payer: Self-pay

## 2013-05-08 DIAGNOSIS — Z1231 Encounter for screening mammogram for malignant neoplasm of breast: Secondary | ICD-10-CM

## 2013-05-25 ENCOUNTER — Ambulatory Visit: Payer: PRIVATE HEALTH INSURANCE

## 2013-06-16 ENCOUNTER — Ambulatory Visit
Admission: RE | Admit: 2013-06-16 | Discharge: 2013-06-16 | Disposition: A | Discharge status: Home / Self Care | Payer: PRIVATE HEALTH INSURANCE | Source: Ambulatory Visit

## 2013-06-16 DIAGNOSIS — Z1231 Encounter for screening mammogram for malignant neoplasm of breast: Secondary | ICD-10-CM

## 2014-01-29 ENCOUNTER — Encounter: Payer: Self-pay | Admitting: Adult Health

## 2014-01-30 ENCOUNTER — Ambulatory Visit: Payer: PRIVATE HEALTH INSURANCE | Admitting: Orthopedic Surgery

## 2015-06-11 ENCOUNTER — Encounter (INDEPENDENT_AMBULATORY_CARE_PROVIDER_SITE_OTHER): Payer: Self-pay | Admitting: *Deleted

## 2015-06-19 ENCOUNTER — Ambulatory Visit (INDEPENDENT_AMBULATORY_CARE_PROVIDER_SITE_OTHER): Payer: PRIVATE HEALTH INSURANCE | Admitting: Internal Medicine

## 2015-07-04 ENCOUNTER — Encounter (INDEPENDENT_AMBULATORY_CARE_PROVIDER_SITE_OTHER): Payer: Self-pay | Admitting: *Deleted

## 2015-07-04 ENCOUNTER — Ambulatory Visit (INDEPENDENT_AMBULATORY_CARE_PROVIDER_SITE_OTHER): Payer: PRIVATE HEALTH INSURANCE | Admitting: Internal Medicine

## 2015-08-01 ENCOUNTER — Encounter (INDEPENDENT_AMBULATORY_CARE_PROVIDER_SITE_OTHER): Payer: Self-pay

## 2015-08-01 ENCOUNTER — Ambulatory Visit (INDEPENDENT_AMBULATORY_CARE_PROVIDER_SITE_OTHER): Payer: PRIVATE HEALTH INSURANCE | Admitting: Internal Medicine

## 2015-08-01 ENCOUNTER — Encounter (INDEPENDENT_AMBULATORY_CARE_PROVIDER_SITE_OTHER): Payer: Self-pay | Admitting: Internal Medicine

## 2015-08-01 ENCOUNTER — Other Ambulatory Visit (INDEPENDENT_AMBULATORY_CARE_PROVIDER_SITE_OTHER): Payer: Self-pay | Admitting: Internal Medicine

## 2015-08-01 ENCOUNTER — Other Ambulatory Visit (INDEPENDENT_AMBULATORY_CARE_PROVIDER_SITE_OTHER): Payer: Self-pay | Admitting: *Deleted

## 2015-08-01 ENCOUNTER — Encounter (INDEPENDENT_AMBULATORY_CARE_PROVIDER_SITE_OTHER): Payer: Self-pay | Admitting: *Deleted

## 2015-08-01 VITALS — BP 130/72 | HR 72 | Temp 97.0°F | Ht 65.0 in | Wt 253.9 lb

## 2015-08-01 DIAGNOSIS — E119 Type 2 diabetes mellitus without complications: Secondary | ICD-10-CM | POA: Insufficient documentation

## 2015-08-01 DIAGNOSIS — D509 Iron deficiency anemia, unspecified: Secondary | ICD-10-CM

## 2015-08-01 LAB — IRON AND TIBC
%SAT: 10 % — ABNORMAL LOW (ref 11–50)
Iron: 38 ug/dL — ABNORMAL LOW (ref 45–160)
TIBC: 377 ug/dL (ref 250–450)
UIBC: 339 ug/dL (ref 125–400)

## 2015-08-01 LAB — FERRITIN: Ferritin: 60 ng/mL (ref 10–232)

## 2015-08-01 NOTE — Telephone Encounter (Signed)
Patient needs trilyte 

## 2015-08-01 NOTE — Progress Notes (Signed)
   Subjective:    Patient ID: Susan Quinn, female    DOB: Apr 24, 1960, 55 y.o.   MRN: [id number]  HPI Referred by Dr. Wende Neighbors for anemia/colonoscopy.  Hx of anemia dating back per Epic to 2009. She is menopausal.  She has not seen any blood in her stool.  Appetite is good. No significant weight loss. There is no abdominal pain. She has a BM daily.  She does have acid reflux and takes Omeprozole OTC for this.  She has never undergone a colonoscopy in the past. Hx of diabetes x 6 months.  No family hx of colon cancer. 06/07/2015 11.2, MCV 73.2    Review of Systems Past Medical History  Diagnosis Date  . Hypertension   . Anemia   . Fibroids     history of  . Bowel obstruction ([place])   . Sleep apnea   . Reflux 06/30/2012  . Obesity 06/30/2012  . Fibroids   . Sleep apnea   . Infection of mouth 2014    Past Surgical History  Procedure Laterality Date  . Abdominal hysterectomy    . Breast surgery    . Excision of scar    .  bowel obstruction    . Cesarean section      No Known Allergies  Current Outpatient Prescriptions on File Prior to Visit  Medication Sig Dispense Refill  . hydrochlorothiazide (HYDRODIURIL) 25 MG tablet TAKE 1 TABLET BY MOUTH EVERY DAY FOR BLOOD PRESSURE 90 tablet 6  . omeprazole (PRILOSEC) 20 MG capsule Take 20 mg by mouth daily. Reported on 08/01/2015     No current facility-administered medications on file prior to visit.        Objective:   Physical Exam Blood pressure 130/72, pulse 72, temperature 97 F (36.1 C), height 5\' 5"  (1.651 m), weight 253 lb 14.4 oz (115.168 kg). Alert and oriented. Skin warm and dry. Oral mucosa is moist.   . Sclera anicteric, conjunctivae is pink. Thyroid not enlarged. No cervical lymphadenopathy. Lungs clear. Heart regular rate and rhythm.  Abdomen is soft. Bowel sounds are positive. No hepatomegaly. No abdominal masses felt. No tenderness.  No edema to lower extremities.         Assessment & Plan:  Anemia.  Will get iron studies and set her up for a colonoscopy.  The risks and benefits such as perforation, bleeding, and infection were reviewed with the patient and is agreeable.

## 2015-08-01 NOTE — Patient Instructions (Signed)
Colonoscopy.  The risks and benefits such as perforation, bleeding, and infection were reviewed with the patient and is agreeable. 

## 2015-08-05 MED ORDER — PEG 3350-KCL-NA BICARB-NACL 420 G PO SOLR: 4000.0000 mL | Freq: Once | ORAL | Status: DC

## 2015-09-17 ENCOUNTER — Other Ambulatory Visit (INDEPENDENT_AMBULATORY_CARE_PROVIDER_SITE_OTHER): Payer: Self-pay | Admitting: Internal Medicine

## 2015-09-17 ENCOUNTER — Encounter (INDEPENDENT_AMBULATORY_CARE_PROVIDER_SITE_OTHER): Payer: Self-pay | Admitting: *Deleted

## 2015-09-17 DIAGNOSIS — D649 Anemia, unspecified: Secondary | ICD-10-CM

## 2015-09-18 ENCOUNTER — Encounter (HOSPITAL_COMMUNITY): Payer: Self-pay

## 2015-09-18 ENCOUNTER — Ambulatory Visit (HOSPITAL_COMMUNITY): Admit: 2015-09-18 | Payer: PRIVATE HEALTH INSURANCE | Admitting: Internal Medicine

## 2015-09-18 SURGERY — COLONOSCOPY
Anesthesia: Moderate Sedation

## 2015-10-17 MED ORDER — SODIUM CHLORIDE 0.9 % IV SOLN
INTRAVENOUS | Status: DC
  Administered 2015-10-18: 09:00:00 via INTRAVENOUS

## 2015-10-18 ENCOUNTER — Encounter (HOSPITAL_COMMUNITY)
Admission: RE | Disposition: A | Discharge status: Home / Self Care | Payer: Self-pay | Source: Ambulatory Visit | Attending: Internal Medicine

## 2015-10-18 ENCOUNTER — Encounter (HOSPITAL_COMMUNITY): Payer: Self-pay | Admitting: *Deleted

## 2015-10-18 ENCOUNTER — Ambulatory Visit (HOSPITAL_COMMUNITY)
Admission: RE | Admit: 2015-10-18 | Discharge: 2015-10-18 | Disposition: A | Discharge status: Home / Self Care | Payer: PRIVATE HEALTH INSURANCE | Source: Ambulatory Visit | Attending: Internal Medicine | Admitting: Internal Medicine

## 2015-10-18 DIAGNOSIS — I10 Essential (primary) hypertension: Secondary | ICD-10-CM | POA: Diagnosis not present

## 2015-10-18 DIAGNOSIS — K219 Gastro-esophageal reflux disease without esophagitis: Secondary | ICD-10-CM | POA: Diagnosis not present

## 2015-10-18 DIAGNOSIS — Z1211 Encounter for screening for malignant neoplasm of colon: Secondary | ICD-10-CM | POA: Insufficient documentation

## 2015-10-18 DIAGNOSIS — Z6841 Body Mass Index (BMI) 40.0 and over, adult: Secondary | ICD-10-CM | POA: Diagnosis not present

## 2015-10-18 DIAGNOSIS — D509 Iron deficiency anemia, unspecified: Secondary | ICD-10-CM

## 2015-10-18 DIAGNOSIS — Z79899 Other long term (current) drug therapy: Secondary | ICD-10-CM | POA: Insufficient documentation

## 2015-10-18 DIAGNOSIS — D638 Anemia in other chronic diseases classified elsewhere: Secondary | ICD-10-CM | POA: Insufficient documentation

## 2015-10-18 DIAGNOSIS — K648 Other hemorrhoids: Secondary | ICD-10-CM | POA: Insufficient documentation

## 2015-10-18 DIAGNOSIS — Z7984 Long term (current) use of oral hypoglycemic drugs: Secondary | ICD-10-CM | POA: Diagnosis not present

## 2015-10-18 DIAGNOSIS — E669 Obesity, unspecified: Secondary | ICD-10-CM | POA: Insufficient documentation

## 2015-10-18 DIAGNOSIS — K644 Residual hemorrhoidal skin tags: Secondary | ICD-10-CM | POA: Diagnosis not present

## 2015-10-18 DIAGNOSIS — G4730 Sleep apnea, unspecified: Secondary | ICD-10-CM | POA: Insufficient documentation

## 2015-10-18 HISTORY — PX: COLONOSCOPY: SHX5424

## 2015-10-18 LAB — GLUCOSE, CAPILLARY: GLUCOSE-CAPILLARY: 107 mg/dL — AB (ref 65–99)

## 2015-10-18 SURGERY — COLONOSCOPY
Anesthesia: Moderate Sedation

## 2015-10-18 MED ORDER — MEPERIDINE HCL 50 MG/ML IJ SOLN
INTRAMUSCULAR | Status: AC
  Filled 2015-10-18: qty 1

## 2015-10-18 MED ORDER — MIDAZOLAM HCL 5 MG/5ML IJ SOLN
INTRAMUSCULAR | Status: DC | PRN
  Administered 2015-10-18 (×2): 2 mg via INTRAVENOUS

## 2015-10-18 MED ORDER — STERILE WATER FOR IRRIGATION IR SOLN
Status: DC | PRN
  Administered 2015-10-18: 2.5 mL

## 2015-10-18 MED ORDER — MEPERIDINE HCL 50 MG/ML IJ SOLN
INTRAMUSCULAR | Status: DC | PRN
  Administered 2015-10-18 (×2): 25 mg via INTRAVENOUS

## 2015-10-18 MED ORDER — MIDAZOLAM HCL 5 MG/5ML IJ SOLN
INTRAMUSCULAR | Status: AC
  Filled 2015-10-18: qty 10

## 2015-10-18 NOTE — Discharge Instructions (Signed)
Resume usual medications and diet. No driving for 24 hours. Next screening exam in 10 years.  Colonoscopy, Care After Refer to this sheet in the next few weeks. These instructions provide you with information on caring for yourself after your procedure. Your health care provider may also give you more specific instructions. Your treatment has been planned according to current medical practices, but problems sometimes occur. Call your health care provider if you have any problems or questions after your procedure. WHAT TO EXPECT AFTER THE PROCEDURE  After your procedure, it is typical to have the following:  A small amount of blood in your stool.  Moderate amounts of gas and mild abdominal cramping or bloating. HOME CARE INSTRUCTIONS  Do not drive, operate machinery, or sign important documents for 24 hours.  You may shower and resume your regular physical activities, but move at a slower pace for the first 24 hours.  Take frequent rest periods for the first 24 hours.  Walk around or put a warm pack on your abdomen to help reduce abdominal cramping and bloating.  Drink enough fluids to keep your urine clear or pale yellow.  You may resume your normal diet as instructed by your health care provider. Avoid heavy or fried foods that are hard to digest.  Avoid drinking alcohol for 24 hours or as instructed by your health care provider.  Only take over-the-counter or prescription medicines as directed by your health care provider.  If a tissue sample (biopsy) was taken during your procedure:  Do not take aspirin or blood thinners for 7 days, or as instructed by your health care provider.  Do not drink alcohol for 7 days, or as instructed by your health care provider.  Eat soft foods for the first 24 hours. SEEK MEDICAL CARE IF: You have persistent spotting of blood in your stool 2-3 days after the procedure. SEEK IMMEDIATE MEDICAL CARE IF:  You have more than a small spotting of  blood in your stool.  You pass large blood clots in your stool.  Your abdomen is swollen (distended).  You have nausea or vomiting.  You have a fever.  You have increasing abdominal pain that is not relieved with medicine.   This information is not intended to replace advice given to you by your health care provider. Make sure you discuss any questions you have with your health care provider.   Document Released: 10/29/2003 Document Revised: 01/04/2013 Document Reviewed: 11/21/2012 Elsevier Interactive Patient Education [employer].

## 2015-10-18 NOTE — Op Note (Signed)
COLONOSCOPY PROCEDURE REPORT  PATIENT:  Susan Quinn  MR#:  [id number] Birthdate:  [birth year], 55 y.o., female Endoscopist:  Dr. Rogene Houston, MD Referred By:  Dr. Delphina Cahill, MD  Procedure Date: 10/18/2015  Procedure:   Colonoscopy  Indications: Patient is 55 year old African-American female was undergoing average risk screening colonoscopy. This is patient's first exam. She has anemia of chronic disease.  Informed Consent:  The procedure and risks were reviewed with the patient and informed consent was obtained.  Medications:  Demerol 50 mg IV Versed 10 mg IV   Description of procedure:  After a digital rectal exam was performed, that colonoscope was advanced from the anus through the rectum and colon to the area of the cecum, ileocecal valve and appendiceal orifice. The cecum was deeply intubated. These structures were well-seen and photographed for the record. From the level of the cecum and ileocecal valve, the scope was slowly and cautiously withdrawn. The mucosal surfaces were carefully surveyed utilizing scope tip to flexion to facilitate fold flattening as needed. The scope was pulled down into the rectum where a thorough exam including retroflexion was performed.  Findings:   Prep excellent. Normal mucosa of cecum, ascending colon, hepatic flexure, transverse colon, splenic flexure, descending and sigmoid colon. Normal rectal mucosa. Small hemorrhoids noted below the dentate line along with moderate-sized hemorrhoids above the dentate line.    Therapeutic/Diagnostic Maneuvers Performed:   None  Complications:  None  EBL: None  Cecal Withdrawal Time:  11 minutes  Impression:  Examination performed to cecum. Normal colonoscopy except for internal and external hemorrhoids  Recommendations:  Standard instructions given. Next screening exam in 10 years.  Hildred Laser  10/18/2015 4:58 PM  CC: Dr. Wende Neighbors, MD & Dr. Rayne Du ref. provider found

## 2015-10-18 NOTE — H&P (Signed)
Susan Quinn is an 55 y.o. female.   Chief Complaint: Patient is here for colonoscopy. HPI: Patient is 55 year old African-American female was screening colonoscopy. She denies abdominal pain change in bowel habits or rectal bleeding. She has anemia of chronic disease. History is negative for CRC.  Past Medical History  Diagnosis Date  . Hypertension   . Anemia   . Fibroids     history of  . Bowel obstruction ([place])   . Sleep apnea   . Reflux 06/30/2012  . Obesity 06/30/2012  . Fibroids   . Sleep apnea   . Infection of mouth 2014    Past Surgical History  Procedure Laterality Date  . Abdominal hysterectomy    . Breast surgery    . Excision of scar    .  bowel obstruction    . Cesarean section      Family History  Problem Relation Age of Onset  . Hypertension Mother   . Diabetes Mother   . Diabetes Father   . Hypertension Father   . Cancer Sister 97    pancreatic   Social History:  reports that she has never smoked. She has never used smokeless tobacco. She reports that she drinks about 4.8 oz of alcohol per week. She reports that she does not use illicit drugs.  Allergies: No Known Allergies  Medications Prior to Admission  Medication Sig Dispense Refill  . diclofenac (VOLTAREN) 75 MG EC tablet Take 1 tablet by mouth 2 (two) times daily as needed for mild pain.   2  . hydrochlorothiazide (HYDRODIURIL) 25 MG tablet TAKE 1 TABLET BY MOUTH EVERY DAY FOR BLOOD PRESSURE 90 tablet 6  . lisinopril (PRINIVIL,ZESTRIL) 10 MG tablet Take 10 mg by mouth daily.    Marland Kitchen omeprazole (PRILOSEC) 20 MG capsule Take 20 mg by mouth daily. Reported on 08/01/2015    . polyethylene glycol-electrolytes (NULYTELY/GOLYTELY) 420 g solution Take 4,000 mLs by mouth once. 4000 mL 0  . sitaGLIPtin-metformin (JANUMET) 50-500 MG tablet Take 1 tablet by mouth 2 (two) times daily with a meal.      Results for orders placed or performed during the hospital encounter of 10/18/15 (from the past 48 hour(s))   Glucose, capillary     Status: Abnormal   Collection Time: 10/18/15  8:39 AM  Result Value Ref Range   Glucose-Capillary 107 (H) 65 - 99 mg/dL   No results found.  ROS  Blood pressure 134/69, pulse 77, temperature 98.5 F (36.9 C), temperature source Oral, resp. rate 20, height 5\' 5"  (1.651 m), weight 250 lb (113.399 kg), SpO2 98 %. Physical Exam  Constitutional: She appears well-developed and well-nourished.  HENT:  Mouth/Throat: Oropharynx is clear and moist.  Eyes: Conjunctivae are normal. No scleral icterus.  Neck: No thyromegaly present.  Cardiovascular: Normal rate, regular rhythm and normal heart sounds.   No murmur heard. Respiratory: Effort normal and breath sounds normal.  GI: Soft. She exhibits no distension and no mass. There is no tenderness.  Musculoskeletal: She exhibits no edema.  Lymphadenopathy:    She has no cervical adenopathy.  Neurological: She is alert.  Skin: Skin is warm and dry.     Assessment/Plan Average risk screening colonoscopy.  Hildred Laser, MD 10/18/2015, 9:36 AM

## 2015-10-21 ENCOUNTER — Encounter (HOSPITAL_COMMUNITY): Payer: Self-pay | Admitting: Internal Medicine

## 2016-01-29 ENCOUNTER — Telehealth: Payer: Self-pay | Admitting: Orthopedic Surgery

## 2016-01-29 NOTE — Telephone Encounter (Signed)
Patient calledinquiring about appointment; relayed "2nd opinion" protocol, as patient states that since her last visit with Dr Aline Brochure in 2007 -- states has had Xrays, injections, and visits with another provider.  Will obtain notes and films for Dr Aline Brochure to review; appointment pending.

## 2017-02-16 ENCOUNTER — Ambulatory Visit (INDEPENDENT_AMBULATORY_CARE_PROVIDER_SITE_OTHER): Payer: PRIVATE HEALTH INSURANCE

## 2017-02-16 ENCOUNTER — Ambulatory Visit (INDEPENDENT_AMBULATORY_CARE_PROVIDER_SITE_OTHER): Payer: PRIVATE HEALTH INSURANCE | Admitting: Orthopedic Surgery

## 2017-02-16 ENCOUNTER — Other Ambulatory Visit: Payer: Self-pay | Admitting: Internal Medicine

## 2017-02-16 VITALS — BP 141/89 | HR 90 | Ht 65.0 in | Wt 240.0 lb

## 2017-02-16 DIAGNOSIS — M545 Low back pain, unspecified: Secondary | ICD-10-CM

## 2017-02-16 DIAGNOSIS — M5127 Other intervertebral disc displacement, lumbosacral region: Secondary | ICD-10-CM

## 2017-02-16 DIAGNOSIS — Z1231 Encounter for screening mammogram for malignant neoplasm of breast: Secondary | ICD-10-CM

## 2017-02-16 MED ORDER — TRAMADOL HCL 50 MG PO TABS: 50.0000 mg | ORAL_TABLET | Freq: Four times a day (QID) | ORAL | 0 refills | Status: DC

## 2017-02-16 MED ORDER — IBUPROFEN 800 MG PO TABS: 800.0000 mg | ORAL_TABLET | Freq: Three times a day (TID) | ORAL | 1 refills | Status: DC | PRN

## 2017-02-16 MED ORDER — METHOCARBAMOL 750 MG PO TABS: 750.0000 mg | ORAL_TABLET | Freq: Four times a day (QID) | ORAL | 2 refills | Status: DC

## 2017-02-16 NOTE — Progress Notes (Signed)
NEW PATIENT OFFICE VISIT    Chief Complaint  Patient presents with  . Back Pain    since Nov 4th has had pain after sitting on bleachers at ballgame  . Constipation    56 yo female with no history of sciatica presents with a 3-week history of severe lower back pain with bilateral radiating posterior thigh pain.  Her pain started after sitting at a football game for several hours.  She says the pain crippled her.  She went to her primary care doctor who gave her intramuscular injection of cortisone and place her on a Medrol Dosepak.  She does not get relief and went back to him.  At that time she received an injection of medication for pain was started on a muscle relaxer and tramadol.  She continued to have severe pain associated with bilateral muscle spasms and posterior thigh pain which localized to the right lower extremity and now is localized to the central area of her lower back.  She has having trouble driving sitting as well as standing  She has had a significant change in bowel function over the last 10 days    Review of Systems  Constitutional: Negative for chills, fever, malaise/fatigue and weight loss.  Gastrointestinal: Positive for constipation.  Genitourinary: Negative.   Musculoskeletal: Positive for back pain and joint pain.  Neurological: Negative for tingling, sensory change and focal weakness.     Past Medical History:  Diagnosis Date  . Anemia   . Bowel obstruction   . Fibroids    history of  . Fibroids   . Hypertension   . Infection of mouth 2014  . Obesity 06/30/2012  . Reflux 06/30/2012  . Sleep apnea   . Sleep apnea     Past Surgical History:  Procedure Laterality Date  .  bowel obstruction    . ABDOMINAL HYSTERECTOMY    . BREAST SURGERY    . CESAREAN SECTION    . COLONOSCOPY N/A 10/18/2015   Performed by Rogene Houston, MD at [facility]  . excision of scar      Family History  Problem Relation Age of Onset  . Hypertension Mother   .  Diabetes Mother   . Diabetes Father   . Hypertension Father   . Cancer Sister 34       pancreatic   Social History   Tobacco Use  . Smoking status: Never Smoker  . Smokeless tobacco: Never Used  Substance Use Topics  . Alcohol use: Yes    Alcohol/week: 4.8 oz    Types: 8 Glasses of wine per week    Comment: wine daily about a glass or 2  . Drug use: No    No Known Allergies   Current Meds  Medication Sig  . diclofenac (VOLTAREN) 75 MG EC tablet Take 1 tablet by mouth 2 (two) times daily as needed for mild pain.   . hydrochlorothiazide (HYDRODIURIL) 25 MG tablet TAKE 1 TABLET BY MOUTH EVERY DAY FOR BLOOD PRESSURE  . lisinopril (PRINIVIL,ZESTRIL) 10 MG tablet Take 10 mg by mouth daily.  . metFORMIN (GLUCOPHAGE) 500 MG tablet Take 500 mg 2 (two) times daily with a meal by mouth.  Marland Kitchen omeprazole (PRILOSEC) 20 MG capsule Take 20 mg by mouth daily. Reported on 08/01/2015  . traMADol (ULTRAM) 50 MG tablet TK 1 T PO Q 6 H PRF PAIN    BP (!) 141/89   Pulse 90   Ht 5\' 5"  (1.651 m)   Wt  240 lb (108.9 kg)   BMI 39.94 kg/m   Physical Exam  Constitutional: She is oriented to person, place, and time. Vital signs are normal. She appears well-developed and well-nourished.  Musculoskeletal:       Lumbar back: She exhibits decreased range of motion, tenderness, bony tenderness and spasm. She exhibits no swelling, no edema, no deformity and no laceration.       Legs: Neurological: She is alert and oriented to person, place, and time. She has normal strength. She displays no atrophy and no tremor. No cranial nerve deficit or sensory deficit. She exhibits normal muscle tone. She displays no seizure activity. Coordination and gait normal. She displays no Babinski's sign on the right side. She displays no Babinski's sign on the left side.  Reflex Scores:      Patellar reflexes are 2+ on the right side and 2+ on the left side.      Achilles reflexes are 1+ on the right side and 1+ on the left  side. Straight leg raises are negative on the left but positive on the right in the seated position with exacerbation with dorsiflexion of the right foot.  Psychiatric: She has a normal mood and affect. Judgment normal.  Vitals reviewed.   Ortho Exam  Meds ordered this encounter  Medications  . DISCONTD: traMADol (ULTRAM) 50 MG tablet    Sig: TK 1 T PO Q 6 H PRF PAIN    Refill:  0  . metFORMIN (GLUCOPHAGE) 500 MG tablet    Sig: Take 500 mg 2 (two) times daily with a meal by mouth.  . methocarbamol (ROBAXIN) 750 MG tablet    Sig: Take 1 tablet (750 mg total) 4 (four) times daily by mouth.    Dispense:  60 tablet    Refill:  2  . ibuprofen (ADVIL,MOTRIN) 800 MG tablet    Sig: Take 1 tablet (800 mg total) every 8 (eight) hours as needed by mouth.    Dispense:  90 tablet    Refill:  1  . traMADol (ULTRAM) 50 MG tablet    Sig: Take 1 tablet (50 mg total) 4 (four) times daily by mouth.    Dispense:  30 tablet    Refill:  0    Encounter Diagnosis  Name Primary?  . Acute midline low back pain without sciatica Yes     PLAN:   I ordered an x-ray of her back.  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  Overall spinal alignment was normal.  There is no spondylolysis or listhesis and no evidence of tumor or fracture  The patient has exhibited signs of disc herniation with the bowel changes which have not responded to stool softeners although she did have a bowel movement today  Recommend a continuation of tramadol, addition of ibuprofen and starting muscle relaxer Robaxin  MRI to evaluate neural elements for further treatment recommendations

## 2017-02-16 NOTE — Patient Instructions (Addendum)
Your MRI has been ordered.  We will contact your insurance company for approval. After the authorization is received the MRI and a return appointment will be scheduled with you by phone.  If you do not hear from [place] within 5 business days please call [phone] and ask for Amy Herniated Disk A herniated disk, also called a ruptured disk or slipped disk, occurs when a disk in the spine bulges out too far. Between the bones in the spine (vertebrae), there are oval disks that are made of a soft, spongy center that is surrounded by a tough outer ring. The disks connect your vertebrae, help your spine move, and absorb shocks from your movement. When you have a herniated disk, the spongy center of the disk bulges out or breaks through the outer ring. It can press on a nerve between the vertebrae and cause pain. This can occur anywhere in the back or neck area, but the lower back is most commonly affected. What are the causes? This condition may be caused by:  Age-related wear and tear. The spongy centers of spinal disks tend to shrink and dry out with age, which makes them more likely to herniate.  Sudden injury, such as a strain or sprain.  What increases the risk? Aging is the main risk factor for a herniated disk. Other risk factors include:  Being a man who is 21-89 years old.  Frequently doing activities that involve heavy lifting, bending, or twisting.  Frequently driving for long hours at a time.  Not getting enough exercise.  Being overweight.  Smoking.  Having a family history of back problems or herniated disks.  Being pregnant or giving birth.  Having poor nutrition.  Being tall.  What are the signs or symptoms? Symptoms may vary depending on where your herniated disk is located.  A herniated disk in the lower back may cause sharp pain in: ? Part of the arm, leg, hip, or buttocks. ? The back of the lower leg (calf). ? The lower back, spreading down through the leg into  the foot (sciatica).  A herniated disk in the neck may cause dizziness and vertigo. It may also cause pain or weakness in: ? The neck. ? The shoulder blades. ? Upper arm, forearm, or fingers.  You may also have muscle weakness. It may be difficult to: ? Lift your leg or arm. ? Stand on your toes. ? Squeeze tightly with one of your hands.  Other symptoms may include: ? Numbness or tingling in the affected areas of the hands, arms, feet, or legs. ? Inability to control when you urinate or when you have bowel movements. This is a rare but serious sign of a severe herniated disk in the lower back.  How is this diagnosed? This condition may be diagnosed based on:  Your symptoms.  Your medical history.  A physical exam. The exam may include: ? Straight-leg test. You will lie on your back while your health care provider lifts your leg, keeping your knee straight. If you feel pain, you likely have a herniated disk. ? Neurological tests. This includes checking for numbness, reflexes, muscle strength, and posture.  Imaging tests, such as: ? X-rays. ? MRI. ? CT scan. ? Electromyogram (EMG) to check the nerves that control muscles. This test may be used to determine which nerves are affected by your herniated disk.  How is this treated? Treatment for this condition may include:  A short period of rest. This is usually the first treatment. ?  You may be on bed rest for up to 2 days, or you may be instructed to stay home and avoid physical activity. ? If you have a herniated disk in your lower back, avoid sitting as much as possible. Sitting increases pressure on the disk.  Medicines. These may include: ? NSAIDs to help reduce pain and swelling. ? Muscle relaxants to prevent sudden tightening of the back muscles (back spasms). ? Prescription pain medicines, if you have severe pain.  Steroid injections in the area of the herniated disk. This can help reduce pain and swelling.  Physical  therapy to strengthen your back muscles.  In many cases, symptoms go away with treatment over a period of days or weeks. You will most likely be free of symptoms after 3-4 months. If other treatments do not help to relieve your symptoms, you may need surgery. Follow these instructions at home: Medicines  Take over-the-counter and prescription medicines only as told by your health care provider.  Do not drive or use heavy machinery while taking prescription pain medicine. Activity  Rest as directed.  After your rest period: ? Return to your normal activities and gradually begin exercising as told by your health care provider. Ask your health care provider what activities and exercises are safe for you. ? Use good posture. ? Avoid movements that cause pain. ? Do not lift anything that is heavier than 10 lb (4.5 kg) until your health care provider says this is safe. ? Do not sit or stand for long periods of time without changing positions. ? Do not sit for long periods of time without getting up and moving around.  If physical therapy was prescribed, do exercises as instructed.  Aim to strengthen muscles in your back and abdomen with exercises like crunches, swimming, or walking. General instructions  Do not use any products that contain nicotine or tobacco, such as cigarettes and e-cigarettes. These products can delay healing. If you need help quitting, ask your health care provider.  Do not wear high-heeled shoes.  Do not sleep on your belly.  If you are overweight, work with your health care provider to lose weight safely.  To prevent or treat constipation while you are taking prescription pain medicine, your health care provider may recommend that you: ? Drink enough fluid to keep your urine clear or pale yellow. ? Take over-the-counter or prescription medicines. ? Eat foods that are high in fiber, such as fresh fruits and vegetables, whole grains, and beans. ? Limit foods that  are high in fat and processed sugars, such as fried and sweet foods.  Keep all follow-up visits as told by your health care provider. This is important. How is this prevented?  Maintain a healthy weight.  Try to avoid stressful situations.  Maintain physical fitness. Do at least 150 minutes of moderate-intensity exercise each week, such as brisk walking or water aerobics.  When lifting objects: ? Keep your feet at least shoulder-width apart and tighten your abdominal muscles. ? Keep your spine neutral as you bend your knees and hips. It is important to lift using the strength of your legs, not your back. Do not lock your knees straight out. ? Always ask for help to lift heavy or awkward objects. Contact a health care provider if:  You have back pain or neck pain that does not get better after 6 weeks.  You have severe pain in your back, neck, legs, or arms.  You develop numbness, tingling, or weakness in any  part of your body. Get help right away if:  You cannot move your arms or legs.  You cannot control when you urinate or have bowel movements.  You feel dizzy or you faint.  You have shortness of breath. This information is not intended to replace advice given to you by your health care provider. Make sure you discuss any questions you have with your health care provider. Document Released: 03/13/2000 Document Revised: 11/11/2015 Document Reviewed: 11/11/2015 Elsevier Interactive Patient Education  2017 [employer].

## 2017-02-17 ENCOUNTER — Telehealth: Payer: Self-pay | Admitting: Orthopedic Surgery

## 2017-02-17 NOTE — Telephone Encounter (Signed)
Denial, MRI I did tell them she has a positive SLR and she has had bowel changes/ constipation, she does not meet criteria.  Please advise

## 2017-02-17 NOTE — Telephone Encounter (Signed)
Call received via voice message from [place] at [facility] regarding request for pre-authorization of MRI of lumbar spine - states it was reviewed by their physician, and it has been denied for lack of conservative treatment which would include physical therapy or exercises, analgesics or NSAIDS. States there had been no documentation as to invasive treatment such as epidural steroid injection or surgery. States Option of Peer to Peer review, or Appeal. If choose peer to peer, please call back with 3 different dates and times that would be workable for a call to Dr Aline Brochure by their physician (their availability is Monday-Friday 2:00 to 4:00PM)  Her direct phone # to call back: [phone], option 1, Ext [phone]

## 2017-03-15 ENCOUNTER — Telehealth: Payer: Self-pay | Admitting: Radiology

## 2017-03-15 DIAGNOSIS — M545 Low back pain, unspecified: Secondary | ICD-10-CM

## 2017-03-15 NOTE — Telephone Encounter (Signed)
I called patient to discuss, she states she is in pain with her back, I told her MRI scan denied by the insurance.  She states she is willing to do physical therapy but states she needs to get in to see you soon because she has back and leg pain  I left her a message on 02/17/17 to advise MRI scan denied, she has not called back until now, she states she did leave a message last week  She is asking to be worked into the schedule, but I have nothing to offer, can you help ?     I did send in the order for the physical therapy since she agrees.

## 2017-03-15 NOTE — Telephone Encounter (Signed)
Patients insurance has denied to cover the MRI scan, she needs a follow up appt She left a message for me to call her back I will call her today

## 2017-03-15 NOTE — Telephone Encounter (Signed)
Ok 230 on the 20th

## 2017-03-16 ENCOUNTER — Ambulatory Visit
Admission: RE | Admit: 2017-03-16 | Discharge: 2017-03-16 | Disposition: A | Discharge status: Home / Self Care | Payer: PRIVATE HEALTH INSURANCE | Source: Ambulatory Visit | Attending: Internal Medicine | Admitting: Internal Medicine

## 2017-03-16 DIAGNOSIS — Z1231 Encounter for screening mammogram for malignant neoplasm of breast: Secondary | ICD-10-CM

## 2017-03-16 NOTE — Telephone Encounter (Signed)
I called patient, will you put her on the schedule? It is blocked. Left message for patient to come in at the time indicated and to call me back.

## 2017-03-16 NOTE — Telephone Encounter (Signed)
Called patient this morning. Scheduled per Dr Ruthe Mannan note; confirmed.

## 2017-03-17 ENCOUNTER — Ambulatory Visit: Payer: PRIVATE HEALTH INSURANCE

## 2017-03-18 ENCOUNTER — Encounter: Payer: Self-pay | Admitting: Orthopedic Surgery

## 2017-03-18 ENCOUNTER — Ambulatory Visit (INDEPENDENT_AMBULATORY_CARE_PROVIDER_SITE_OTHER): Payer: PRIVATE HEALTH INSURANCE | Admitting: Orthopedic Surgery

## 2017-03-18 VITALS — BP 156/98 | HR 75 | Ht 65.0 in | Wt 241.0 lb

## 2017-03-18 DIAGNOSIS — M545 Low back pain, unspecified: Secondary | ICD-10-CM

## 2017-03-18 DIAGNOSIS — M5127 Other intervertebral disc displacement, lumbosacral region: Secondary | ICD-10-CM | POA: Diagnosis not present

## 2017-03-18 MED ORDER — METHYLPREDNISOLONE ACETATE 40 MG/ML IJ SUSP
80.0000 mg | Freq: Once | INTRAMUSCULAR | Status: AC
  Administered 2017-03-18: 80 mg via INTRAMUSCULAR

## 2017-03-18 MED ORDER — METHOCARBAMOL 750 MG PO TABS: 750.0000 mg | ORAL_TABLET | Freq: Four times a day (QID) | ORAL | 2 refills | Status: DC

## 2017-03-18 MED ORDER — TRAMADOL HCL 50 MG PO TABS: 50.0000 mg | ORAL_TABLET | Freq: Four times a day (QID) | ORAL | 0 refills | Status: DC

## 2017-03-18 NOTE — Progress Notes (Signed)
Progress Note   Patient ID: Susan Quinn, female   DOB: [birth year], 56 y.o.   MRN: [id number]  Chief Complaint  Patient presents with  . Follow-up    Back pain    56 year old female with a history of lower back pain with bilateral leg pain previously treated with intramuscular injection of cortisone and a Medrol Dosepak who did not get relief and presented to [place] on tramadol and a muscle aching symptoms of a central disc herniation.  She had change in bowel function with constipation which was not relieved by normal medications.  We asked for an MRI it was declined so we asked for her to be seen by physical therapist that appointment is not until January 7.  She comes back in with worsening back and leg pain continued constipation which is described as severe causing her to have trouble walking.  She is now had symptoms for 30 days.  (The pain is in the lower back and radiates to both legs, it is sharp and burning, severe, constant but worse with spinal flexion relieved by spinal extension not improving associated with constipation)     Review of Systems  Constitutional: Negative for chills, fever, malaise/fatigue and weight loss.  Gastrointestinal: Positive for constipation.  Musculoskeletal: Positive for back pain.  Neurological: Positive for tingling. Negative for sensory change and focal weakness.   Current Meds  Medication Sig  . diclofenac (VOLTAREN) 75 MG EC tablet Take 1 tablet by mouth 2 (two) times daily as needed for mild pain.   . hydrochlorothiazide (HYDRODIURIL) 25 MG tablet TAKE 1 TABLET BY MOUTH EVERY DAY FOR BLOOD PRESSURE  . ibuprofen (ADVIL,MOTRIN) 800 MG tablet Take 1 tablet (800 mg total) every 8 (eight) hours as needed by mouth.  Marland Kitchen lisinopril (PRINIVIL,ZESTRIL) 10 MG tablet Take 10 mg by mouth daily.  . metFORMIN (GLUCOPHAGE) 500 MG tablet Take 500 mg 2 (two) times daily with a meal by mouth.  . methocarbamol (ROBAXIN) 750 MG tablet Take 1 tablet (750 mg total) 4  (four) times daily by mouth.  Marland Kitchen omeprazole (PRILOSEC) 20 MG capsule Take 20 mg by mouth daily. Reported on 08/01/2015  . sitaGLIPtin-metformin (JANUMET) 50-500 MG tablet Take 1 tablet by mouth 2 (two) times daily with a meal.  . traMADol (ULTRAM) 50 MG tablet Take 1 tablet (50 mg total) 4 (four) times daily by mouth.    Past Medical History:  Diagnosis Date  . Anemia   . Bowel obstruction ([place])   . Fibroids    history of  . Fibroids   . Hypertension   . Infection of mouth 2014  . Obesity 06/30/2012  . Reflux 06/30/2012  . Sleep apnea   . Sleep apnea      No Known Allergies  BP (!) 156/98   Pulse 75   Ht 5\' 5"  (1.651 m)   Wt 241 lb (109.3 kg)   BMI 40.10 kg/m    Physical Exam  Constitutional: She is oriented to person, place, and time. She appears well-developed and well-nourished.  Neurological: She is alert and oriented to person, place, and time. She displays normal reflexes. No cranial nerve deficit or sensory deficit. She exhibits normal muscle tone. Coordination normal.  Skin: Capillary refill takes less than 2 seconds.  Psychiatric: She has a normal mood and affect. Her behavior is normal. Thought content normal.  Vitals reviewed.   Ortho Exam Lumbosacral tenderness and increased muscle tension  Increased muscle tension sign on the right with straight leg raise  maneuver negative with Segan negative cross leg straight leg raise test left straight leg raise normal  Hip knee and ankle range of motion is normal in both lower extremities as is hip knee and ankle stability.  No weakness distally on either foot skin warm dry and intact bilaterally pulse and perfusion normal with no edema.  Reflexes 0-1+ at the knee and ankle toes downgoing.  No sensory changes.  Medical decision-making  Imaging: As noted on previous x-ray X-ray AP x-ray shows normal coronal plane alignment mild disc space narrowing at L4 and 5 with sacralization of L5  Hip joints look normal.  Lateral  x-ray shows maintenance of lumbar lordosis, no evidence of spondylolisthesis mild facet arthritis at L4 and 5 and L5 and S1  L3-L4 facet arthrosis is seen as well on the spot film  Impression sacralization of L5 lumbar spondylosis.  Encounter Diagnoses  Name Primary?  . Lumbar pain with radiation down right leg   . Lumbosacral disc herniation Yes    80 mg IM Depo-Medrol injection given at patient's request and sent given.  Nurse gave shot  Continue tramadol Robaxin  Resubmit MRI  She will go to her therapy appointment    Arther Abbott, MD 03/18/2017 3:50 PM

## 2017-03-18 NOTE — Patient Instructions (Addendum)
Back Pain, Adult Many adults have back pain from time to time. Common causes of back pain include:  A strained muscle or ligament.  Wear and tear (degeneration) of the spinal disks.  Arthritis.  A hit to the back.  Back pain can be short-lived (acute) or last a long time (chronic). A physical exam, lab tests, and imaging studies may be done to find the cause of your pain. Follow these instructions at home: Managing pain and stiffness  Take over-the-counter and prescription medicines only as told by your health care provider.  If directed, apply heat to the affected area as often as told by your health care provider. Use the heat source that your health care provider recommends, such as a moist heat pack or a heating pad. ? Place a towel between your skin and the heat source. ? Leave the heat on for 20-30 minutes. ? Remove the heat if your skin turns bright red. This is especially important if you are unable to feel pain, heat, or cold. You have a greater risk of getting burned.  If directed, apply ice to the injured area: ? Put ice in a plastic bag. ? Place a towel between your skin and the bag. ? Leave the ice on for 20 minutes, 2-3 times a day for the first 2-3 days. Activity  Do not stay in bed. Resting more than 1-2 days can delay your recovery.  Take short walks on even surfaces as soon as you are able. Try to increase the length of time you walk each day.  Do not sit, drive, or stand in one place for more than 30 minutes at a time. Sitting or standing for long periods of time can put stress on your back.  Use proper lifting techniques. When you bend and lift, use positions that put less stress on your back: ? Bend your knees. ? Keep the load close to your body. ? Avoid twisting.  Exercise regularly as told by your health care provider. Exercising will help your back heal faster. This also helps prevent back injuries by keeping muscles strong and flexible.  Your health  care provider may recommend that you see a physical therapist. This person can help you come up with a safe exercise program. Do any exercises as told by your physical therapist. Lifestyle  Maintain a healthy weight. Extra weight puts stress on your back and makes it difficult to have good posture.  Avoid activities or situations that make you feel anxious or stressed. Learn ways to manage anxiety and stress. One way to manage stress is through exercise. Stress and anxiety increase muscle tension and can make back pain worse. General instructions  Sleep on a firm mattress in a comfortable position. Try lying on your side with your knees slightly bent. If you lie on your back, put a pillow under your knees.  Follow your treatment plan as told by your health care provider. This may include: ? Cognitive or behavioral therapy. ? Acupuncture or massage therapy. ? Meditation or yoga. Contact a health care provider if:  You have pain that is not relieved with rest or medicine.  You have increasing pain going down into your legs or buttocks.  Your pain does not improve in 2 weeks.  You have pain at night.  You lose weight.  You have a fever or chills. Get help right away if:  You develop new bowel or bladder control problems.  You have unusual weakness or numbness in your arms   or legs.  You develop nausea or vomiting.  You develop abdominal pain.  You feel faint. Summary  Many adults have back pain from time to time. A physical exam, lab tests, and imaging studies may be done to find the cause of your pain.  Use proper lifting techniques. When you bend and lift, use positions that put less stress on your back.  Take over-the-counter and prescription medicines and apply heat or ice as directed by your health care provider. This information is not intended to replace advice given to you by your health care provider. Make sure you discuss any questions you have with your health care  provider. Document Released: 03/16/2005 Document Revised: 04/20/2016 Document Reviewed: 04/20/2016 Elsevier Interactive Patient Education  2018 [employer].  Herniated Disk A herniated disk, also called a ruptured disk or slipped disk, occurs when a disk in the spine bulges out too far. Between the bones in the spine (vertebrae), there are oval disks that are made of a soft, spongy center that is surrounded by a tough outer ring. The disks connect your vertebrae, help your spine move, and absorb shocks from your movement. When you have a herniated disk, the spongy center of the disk bulges out or breaks through the outer ring. It can press on a nerve between the vertebrae and cause pain. This can occur anywhere in the back or neck area, but the lower back is most commonly affected. What are the causes? This condition may be caused by:  Age-related wear and tear. The spongy centers of spinal disks tend to shrink and dry out with age, which makes them more likely to herniate.  Sudden injury, such as a strain or sprain.  What increases the risk? Aging is the main risk factor for a herniated disk. Other risk factors include:  Being a man who is 5-46 years old.  Frequently doing activities that involve heavy lifting, bending, or twisting.  Frequently driving for long hours at a time.  Not getting enough exercise.  Being overweight.  Smoking.  Having a family history of back problems or herniated disks.  Being pregnant or giving birth.  Having poor nutrition.  Being tall.  What are the signs or symptoms? Symptoms may vary depending on where your herniated disk is located.  A herniated disk in the lower back may cause sharp pain in: ? Part of the arm, leg, hip, or buttocks. ? The back of the lower leg (calf). ? The lower back, spreading down through the leg into the foot (sciatica).  A herniated disk in the neck may cause dizziness and vertigo. It may also cause pain or  weakness in: ? The neck. ? The shoulder blades. ? Upper arm, forearm, or fingers.  You may also have muscle weakness. It may be difficult to: ? Lift your leg or arm. ? Stand on your toes. ? Squeeze tightly with one of your hands.  Other symptoms may include: ? Numbness or tingling in the affected areas of the hands, arms, feet, or legs. ? Inability to control when you urinate or when you have bowel movements. This is a rare but serious sign of a severe herniated disk in the lower back.  How is this diagnosed? This condition may be diagnosed based on:  Your symptoms.  Your medical history.  A physical exam. The exam may include: ? Straight-leg test. You will lie on your back while your health care provider lifts your leg, keeping your knee straight. If you feel  pain, you likely have a herniated disk. ? Neurological tests. This includes checking for numbness, reflexes, muscle strength, and posture.  Imaging tests, such as: ? X-rays. ? MRI. ? CT scan. ? Electromyogram (EMG) to check the nerves that control muscles. This test may be used to determine which nerves are affected by your herniated disk.  How is this treated? Treatment for this condition may include:  A short period of rest. This is usually the first treatment. ? You may be on bed rest for up to 2 days, or you may be instructed to stay home and avoid physical activity. ? If you have a herniated disk in your lower back, avoid sitting as much as possible. Sitting increases pressure on the disk.  Medicines. These may include: ? NSAIDs to help reduce pain and swelling. ? Muscle relaxants to prevent sudden tightening of the back muscles (back spasms). ? Prescription pain medicines, if you have severe pain.  Steroid injections in the area of the herniated disk. This can help reduce pain and swelling.  Physical therapy to strengthen your back muscles.  In many cases, symptoms go away with treatment over a period of  days or weeks. You will most likely be free of symptoms after 3-4 months. If other treatments do not help to relieve your symptoms, you may need surgery. Follow these instructions at home: Medicines  Take over-the-counter and prescription medicines only as told by your health care provider.  Do not drive or use heavy machinery while taking prescription pain medicine. Activity  Rest as directed.  After your rest period: ? Return to your normal activities and gradually begin exercising as told by your health care provider. Ask your health care provider what activities and exercises are safe for you. ? Use good posture. ? Avoid movements that cause pain. ? Do not lift anything that is heavier than 10 lb (4.5 kg) until your health care provider says this is safe. ? Do not sit or stand for long periods of time without changing positions. ? Do not sit for long periods of time without getting up and moving around.  If physical therapy was prescribed, do exercises as instructed.  Aim to strengthen muscles in your back and abdomen with exercises like crunches, swimming, or walking. General instructions  Do not use any products that contain nicotine or tobacco, such as cigarettes and e-cigarettes. These products can delay healing. If you need help quitting, ask your health care provider.  Do not wear high-heeled shoes.  Do not sleep on your belly.  If you are overweight, work with your health care provider to lose weight safely.  To prevent or treat constipation while you are taking prescription pain medicine, your health care provider may recommend that you: ? Drink enough fluid to keep your urine clear or pale yellow. ? Take over-the-counter or prescription medicines. ? Eat foods that are high in fiber, such as fresh fruits and vegetables, whole grains, and beans. ? Limit foods that are high in fat and processed sugars, such as fried and sweet foods.  Keep all follow-up visits as told  by your health care provider. This is important. How is this prevented?  Maintain a healthy weight.  Try to avoid stressful situations.  Maintain physical fitness. Do at least 150 minutes of moderate-intensity exercise each week, such as brisk walking or water aerobics.  When lifting objects: ? Keep your feet at least shoulder-width apart and tighten your abdominal muscles. ? Keep your spine neutral as  you bend your knees and hips. It is important to lift using the strength of your legs, not your back. Do not lock your knees straight out. ? Always ask for help to lift heavy or awkward objects. Contact a health care provider if:  You have back pain or neck pain that does not get better after 6 weeks.  You have severe pain in your back, neck, legs, or arms.  You develop numbness, tingling, or weakness in any part of your body. Get help right away if:  You cannot move your arms or legs.  You cannot control when you urinate or have bowel movements.  You feel dizzy or you faint.  You have shortness of breath. This information is not intended to replace advice given to you by your health care provider. Make sure you discuss any questions you have with your health care provider. Document Released: 03/13/2000 Document Revised: 11/11/2015 Document Reviewed: 11/11/2015 Elsevier Interactive Patient Education  2017 [employer].

## 2017-03-24 ENCOUNTER — Telehealth: Payer: Self-pay | Admitting: Radiology

## 2017-03-24 ENCOUNTER — Other Ambulatory Visit: Payer: Self-pay | Admitting: Orthopedic Surgery

## 2017-03-24 DIAGNOSIS — M545 Low back pain, unspecified: Secondary | ICD-10-CM

## 2017-03-24 NOTE — Telephone Encounter (Signed)
I have resubmitted the information to [facility] and it has now been approved. Dr Aline Brochure is concerned about her bowel changes, so I have added this, I had not done this previously.   Approval from [facility] [id number]  Called to schedule the scan, it is sch for Wed Jan 2nd 8:45 am arrival at [facility].

## 2017-03-24 NOTE — Telephone Encounter (Signed)
Approval for MRI has been obtained from [place], unfortunately her secondary insurance will not cover the scan, it was not approved, she has had 5 weeks of conservative treatment, which is not enough for the scan to be covered  I need to ask her if she wants to proceed with the scan as covered by [employer], of if she wants to wait a couple more weeks and have [place] cover too.  Left message for her to call me back.

## 2017-03-31 ENCOUNTER — Ambulatory Visit (HOSPITAL_COMMUNITY): Payer: PRIVATE HEALTH INSURANCE

## 2017-04-06 ENCOUNTER — Encounter (HOSPITAL_COMMUNITY): Payer: Self-pay

## 2017-04-06 ENCOUNTER — Ambulatory Visit (HOSPITAL_COMMUNITY): Payer: PRIVATE HEALTH INSURANCE | Attending: Orthopedic Surgery

## 2017-04-06 ENCOUNTER — Telehealth: Payer: Self-pay | Admitting: Orthopedic Surgery

## 2017-04-06 DIAGNOSIS — R29898 Other symptoms and signs involving the musculoskeletal system: Secondary | ICD-10-CM | POA: Diagnosis present

## 2017-04-06 DIAGNOSIS — M79604 Pain in right leg: Secondary | ICD-10-CM

## 2017-04-06 DIAGNOSIS — M6281 Muscle weakness (generalized): Secondary | ICD-10-CM

## 2017-04-06 DIAGNOSIS — M79605 Pain in left leg: Secondary | ICD-10-CM

## 2017-04-06 NOTE — Patient Instructions (Addendum)
  PIRIFORMIS STRETCH  While lying on your back with both knee bent, cross your affected leg on the other knee.   Next, hold your unaffected thigh and pull it up towards your chest until a stretch is felt in the buttock.  Perform before you get out of bed in the mornings; 1-2x/day, 3-5 stretches holding for 30-60 seconds

## 2017-04-06 NOTE — Telephone Encounter (Signed)
Patient called - states she received reminder call about her scheduled appointment Friday, 04/09/17, for MRI review.  States she was unable to go through with the MRI - states is claustrophobic.  Please advise patient.   Also inquiring about refill on medication: (states may have waiting at pharmacy?) traMADol (ULTRAM) 50 MG tablet 30 tablet  Patient ph# [phone]

## 2017-04-06 NOTE — Therapy (Signed)
[place], [place], [address] Phone: [phone]   Fax:  [phone]  Physical Therapy Evaluation  Patient Details  Name: Susan Quinn MRN: [id number] Date of Birth: 18-Jun-1960 Referring Provider: Arther Abbott, MD   Encounter Date: 04/06/2017  PT End of Session - 04/06/17 1220    Visit Number  1    Number of Visits  9    Date for PT Re-Evaluation  05/04/17    Authorization Type  [employer] (Secondary: BCBS Other)    Authorization Time Period  04/06/17 to 05/04/17    PT Start Time  1030    PT Stop Time  1114    PT Time Calculation (min)  44 min    Activity Tolerance  Patient tolerated treatment well;No increased pain    Behavior During Therapy  WFL for tasks assessed/performed       Past Medical History:  Diagnosis Date  . Anemia   . Bowel obstruction ([place])   . Fibroids    history of  . Fibroids   . Hypertension   . Infection of mouth 2014  . Obesity 06/30/2012  . Reflux 06/30/2012  . Sleep apnea   . Sleep apnea     Past Surgical History:  Procedure Laterality Date  .  bowel obstruction    . ABDOMINAL HYSTERECTOMY    . BREAST SURGERY    . CESAREAN SECTION    . COLONOSCOPY N/A 10/18/2015   Procedure: COLONOSCOPY;  Surgeon: Rogene Houston, MD;  [facility];  Service: Endoscopy;  Laterality: N/A;  915  . excision of scar    . REDUCTION MAMMAPLASTY Bilateral 10+ years ago    There were no vitals filed for this visit.   Subjective Assessment - 04/06/17 1034    Subjective  Pt reports that she has been having pain in bil buttocks and the pain radiates down to her thighs. She states this started in early November 2018, but denies any traumatic event. She said that she was sitting at a football game and noticed her buttocks hurting and it did not get better. She denies any n/t, but does state that she initially had some constipation issues which Dr. Aline Brochure is aware of, but that has since  improved. She had an MRI scheduled but states that because she is claustrophobic, she has not yet the MRI done. She states it's worse in the mornings and then intermittently throughout the day it can get worse. She is unsure of any aggravating or relieving factors. This pain is keeping her from working out (pickle ball, cardio, etc.); prior to this pain, she was going 2-3days/week.    How long can you sit comfortably?  no issues    How long can you stand comfortably?  unlimited    How long can you walk comfortably?  will ignore the pain and keep going    Diagnostic tests  had MRI scheduled; x-ray completed    Patient Stated Goals  get back active (cardio, classes, pickle ball)    Currently in Pain?  No/denies         [facility] PT Assessment - 04/06/17 0001      Assessment   Medical Diagnosis  Lumbar pain with radiation down R leg    Referring Provider  Arther Abbott, MD    Onset Date/Surgical Date  -- early Novemebr 2018    Next MD Visit  04/09/17    Prior Therapy  none  Precautions   Precautions  None      Balance Screen   Has the patient fallen in the past 6 months  No    Has the patient had a decrease in activity level because of a fear of falling?   No    Is the patient reluctant to leave their home because of a fear of falling?   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, Engineer, manufacturing systems Intact      Posture/Postural Control   Posture/Postural Control  Postural limitations    Postural Limitations  Rounded Shoulders;Forward head      ROM / Strength   AROM / PROM / Strength  AROM;Strength      AROM   AROM Assessment Site  Lumbar    Lumbar Flexion  WNL, non-painful; RFIS x10 reps, decreased pain/abolished pain after REIS    Lumbar Extension  50% limited, increased tail pain; prone press-ups x10 reps no change; REIS x10 reps  increased glute/tail pain    Lumbar - Right Side Bend  WNL, non-painful    Lumbar - Left Side Bend  WNL, non-painful    Lumbar - Right Rotation  WNL, non-painful    Lumbar - Left Rotation  WNL, non-painful      Strength   Strength Assessment Site  Hip;Knee;Ankle    Right Hip Flexion  5/5    Right Hip Extension  4/5    Right Hip ABduction  4/5    Left Hip Flexion  5/5    Left Hip Extension  4/5    Left Hip ABduction  4-/5    Right Knee Flexion  4/5    Right Knee Extension  5/5    Left Knee Flexion  4/5    Left Knee Extension  5/5    Right Ankle Dorsiflexion  5/5    Left Ankle Dorsiflexion  5/5      Flexibility   Soft Tissue Assessment /Muscle Length  yes    Hamstrings  passive SLR and 90/90 WNL BLE    Quadriceps  WFL BLE    Piriformis  tight BLE      Palpation   Spinal mobility  general hypomobility of lumbar spine    Palpation comment  increased soft tissue restrictions and tenderness to palpation of bil glutes, piriformis, HS      Special Tests    Special Tests  Sacrolliac Tests    Sacroiliac Tests   Gaenslen's Test      Pelvic Dictraction   Findings  Negative      Pelvic Compression   Findings  Negative      Sacral thrust    Findings  Positive      Gaenslen's test   Findings  Negative    Side   Right    Comments  neg Lt      Ambulation/Gait   Gait Pattern  Step-through pattern;Trendelenburg    Gait Comments  Trendelenber L>R         Objective measurements completed on examination: See above findings.      PT Education - 04/06/17 1220    Education provided  Yes    Education Details  exam findings, POC, HEP    Person(s) Educated  Patient    Methods  Explanation;Demonstration;Handout  Comprehension  Returned demonstration;Verbalized understanding       PT Short Term Goals - 04/06/17 1658      PT SHORT TERM GOAL #1   Title  Pt will be independent with HEP and perform consistently in order to maximize return to PLOF.    Time  2    Period   Weeks    Status  New    Target Date  04/20/17      PT SHORT TERM GOAL #2   Title  Pt will have improved MMT to at least 4+/5 or > throughout proximal hip musculature to decrease pain and maximize gait.    Time  2    Period  Weeks    Status  New        PT Long Term Goals - 04/06/17 1659      PT LONG TERM GOAL #1   Title  Pt will have improved lumbar extension AROM by 25% or > to decrease pain and maximize overall function.    Time  4    Period  Weeks    Status  New    Target Date  05/04/17      PT LONG TERM GOAL #2   Title  Pt will have report no pain with active hip extension or piriformis flexibility testing in order to demo improved overall soft tissue restrictions and decrease overall c/o pain.    Time  4    Period  Weeks    Status  New      PT LONG TERM GOAL #3   Title  Pt will report being able to wake up without pain 4/7 mornings or > to demo improved overall function.    Time  4    Period  Weeks    Status  New      PT LONG TERM GOAL #4   Title  Pt will report being able to return to her normal exercise routine at the [place] at least 3x/week without exacerbations in pain to demo improved overall function and return to PLOF.     Time  4    Period  Weeks    Status  New      PT LONG TERM GOAL #5   Title  Pt will have no DP's during the [facility] Top Tier to demo improved mobility and overall quality of movement in order to decrease pain and maximize overall QOL.    Time  4    Period  Weeks    Status  New             Plan - 04/06/17 1222    Clinical Impression Statement  Pt is pleasant 57 YO F who presents to OPPT with c/o bil gluteal and posterior thigh pain. She denied any specific traumatic event to cause the pain, only reporting that it started after she had sat on bleachers for a long time during a football game in November 2018. She does not have any n/t or sharp shooting pains down her legs. Her pain starts in her proximal hips/glutes and will go into  her HS but no further. She currently presents with deficits in lumbar extension ROM, proximal hip MMT, functional strength, as well as increased pain, lumbar spine hypomobility, and soft tissue restrictions. Pt stated that active lumbar extension, prone hip extension, and passive piriformis stretching recreated her pain. REIS increased her pain while RFIS immediately following eliminated the pain. Unclear whether RFIS is causing her glutes to stretch which  helps decrease the pain or if she has a mechanical joint response to flexion causing her pain to decrease; will continue to monitor, but either way, pt responds well to flexion-based activities. Her HS flexibility was WNL, almost 80deg with passive SLR and close to 170deg with 90/90 assessment. Pt did have positive sacral thrust but all other SIJ special tests negative. Hypothesize that pt's symptoms are muscular in nature (glutes, piriformis &/or HS involvement) but will continue to monitor and update guided treatments as needed. Feel pt would benefit from [facility] in order to assess her movement patterns and to discern any discrepancies. Pt has not been able to participate in her regular exercise or daily routines due to the pain and she needs skilled PT intervention to address these deficits in order to allow pt to return to her PLOF.     History and Personal Factors relevant to plan of care:  young, active individual, first time dealing with this injury; HTN,     Clinical Presentation  Stable    Clinical Presentation due to:  lumbar ROM, MMT, soft tissue restrictions, gait, functional strength, flexion-preference, pain with active hip extension    Clinical Decision Making  Low    Rehab Potential  Good    PT Frequency  2x / week    PT Duration  4 weeks    PT Treatment/Interventions  ADLs/Self Care Home Management;Cryotherapy;Electrical Stimulation;Moist Heat;Gait training;Stair training;Functional mobility training;Therapeutic activities;Therapeutic  exercise;Balance training;Neuromuscular re-education;Patient/family education;Manual techniques;Passive range of motion;Dry needling;Taping    PT Next Visit Plan  review how the piriformis stretch felt; take pt through [facility] Top Tier and begin breakouts    PT Home Exercise Plan  eval: supine piriformis stretch    Consulted and Agree with Plan of Care  Patient       Patient will benefit from skilled therapeutic intervention in order to improve the following deficits and impairments:  Decreased activity tolerance, Decreased range of motion, Decreased strength, Hypomobility, Increased fascial restricitons, Increased muscle spasms, Improper body mechanics, Postural dysfunction, Pain  Visit Diagnosis: Pain in right leg - Plan: PT plan of care cert/re-cert  Pain in left leg - Plan: PT plan of care cert/re-cert  Muscle weakness (generalized) - Plan: PT plan of care cert/re-cert  Other symptoms and signs involving the musculoskeletal system - Plan: PT plan of care cert/re-cert     Problem List Patient Active Problem List   Diagnosis Date Noted  . Diabetes ([place]) 08/01/2015  . Hypertension 06/30/2012  . Reflux 06/30/2012  . Abscess of upper gum 06/30/2012  . Obesity 06/30/2012      Geraldine Solar PT, [place] [address] [place], [place], [address] Phone: [phone]   Fax:  [phone]  Name: PERLA ECHAVARRIA MRN: [id number] Date of Birth: 09-04-1960

## 2017-04-08 ENCOUNTER — Encounter (HOSPITAL_COMMUNITY): Payer: Self-pay

## 2017-04-08 ENCOUNTER — Ambulatory Visit (HOSPITAL_COMMUNITY): Payer: PRIVATE HEALTH INSURANCE

## 2017-04-08 ENCOUNTER — Other Ambulatory Visit: Payer: Self-pay | Admitting: Radiology

## 2017-04-08 DIAGNOSIS — R29898 Other symptoms and signs involving the musculoskeletal system: Secondary | ICD-10-CM

## 2017-04-08 DIAGNOSIS — M6281 Muscle weakness (generalized): Secondary | ICD-10-CM

## 2017-04-08 DIAGNOSIS — M545 Low back pain: Secondary | ICD-10-CM

## 2017-04-08 DIAGNOSIS — M5127 Other intervertebral disc displacement, lumbosacral region: Secondary | ICD-10-CM

## 2017-04-08 DIAGNOSIS — M79604 Pain in right leg: Secondary | ICD-10-CM | POA: Diagnosis not present

## 2017-04-08 DIAGNOSIS — M79605 Pain in left leg: Secondary | ICD-10-CM

## 2017-04-08 NOTE — Telephone Encounter (Signed)
I spoke to patient, she states she feels a bit better after starting the physical therapy. She wants to know if it would be okay for her to not have the MRI scan and to just continue therapy  I have told her I think that would be fine, but would double check with you.  Ok to hold off on MRI since she was claustrophobic?

## 2017-04-08 NOTE — Therapy (Signed)
[place] [address] [place], [place], [address] Phone: [phone]   Fax:  [phone]  Physical Therapy Treatment  Patient Details  Name: Susan Quinn MRN: [id number] Date of Birth: 26-Apr-1960 Referring Provider: Arther Abbott, MD   Encounter Date: 04/08/2017  PT End of Session - 04/08/17 0812    Visit Number  2    Number of Visits  9    Date for PT Re-Evaluation  05/04/17    Authorization Type  Med[employer] (Secondary: BCBS Other)    Authorization Time Period  04/06/17 to 05/04/17    PT Start Time  0815    PT Stop Time  0855    PT Time Calculation (min)  40 min    Activity Tolerance  Patient tolerated treatment well;No increased pain    Behavior During Therapy  WFL for tasks assessed/performed       Past Medical History:  Diagnosis Date  . Anemia   . Bowel obstruction ([place])   . Fibroids    history of  . Fibroids   . Hypertension   . Infection of mouth 2014  . Obesity 06/30/2012  . Reflux 06/30/2012  . Sleep apnea   . Sleep apnea     Past Surgical History:  Procedure Laterality Date  .  bowel obstruction    . ABDOMINAL HYSTERECTOMY    . BREAST SURGERY    . CESAREAN SECTION    . COLONOSCOPY N/A 10/18/2015   Procedure: COLONOSCOPY;  Surgeon: Rogene Houston, MD;  [facility];  Service: Endoscopy;  Laterality: N/A;  915  . excision of scar    . REDUCTION MAMMAPLASTY Bilateral 10+ years ago    There were no vitals filed for this visit.  Subjective Assessment - 04/08/17 0812    Subjective  Pt denies any pain this morning. She states that she felt stretched out good last time. Her HEP is going well.     How long can you sit comfortably?  no issues    How long can you stand comfortably?  unlimited    How long can you walk comfortably?  will ignore the pain and keep going    Diagnostic tests  had MRI scheduled; x-ray completed    Patient Stated Goals  get back active (cardio, classes, pickle ball)     Currently in Pain?  No/denies         [facility] Top Tier:   FN FP DP DN  Cervical Flexion    X  Cervical Extension    X  Cervical Rotation L    X   R    X  Upper Extremity 1 (MRE) L     X   R    X  Upper Extremity 2 (LRF) L    X   R    X  Multi-Segmental Flexion   X - reported bil HS/posterior thigh pain   Multi-Segmental Extension    X  Multi-Segmental Rotation L    X   R    X  Single-Leg Stance L    X - EC <10 sec   R    X - EC <10 sec  Arms Down Deep Squat   X - reported bil HS/posterior thigh pain     SFMA Breakouts:  Cervical Flexion:   FN FP DP DN  Active Supine Cervical Flexion Test (chin to chest)    X  Passive Supine Cervical Flexion Test L    X  R      Active OA Cervical Flexion Test (20*)  L    X   R    X     Cervical Extension:  FN FP DP DN  Supine Cervical Extension    X     Cervical Rotation:  FN FP DP DN  Active Supine Cervical Rotation Test (80*) L     X   R    X  Passive Supine Cervical Rotation Test (80*) L    X   R    X  C1-C2 Cervical Rotation Test (40*)  L    X - very close to 40    R    X       Upper Extremity Pattern 1 (MRE):  FN FP DP DN  Active Lumbar Lock (IR) Extension/ Rotation Test (50*) L    X   R    X  Passive Lumbar Lock (IR) Extension/ Rotation Test (50*) L    X   R    X  Active Prone UE pattern 1 test L    x   R    X  Passive Prone UE pattern 1 test L    X   R    X  Active Prone Shoulder 90/90 IR test (60* &/or total arc 150*) L    X   R X     Passive Prone Shoulder 90/90 IR test (60* &/or total arc 150*) L 62*      R -      Active prone shoulder extension test (50*) L    X   R    X  Passive Prone Shoulder Extension Test (50*) L  X     R    X  Active Prone Elbow Flexion Test (shoulder extended) L    X   R X     Passive Prone Elbow Flexion Test (shoulder extended) L X      R -          Upper Extremity Pattern 2 (LRF):  FN FP DP DN  Active Lumbar Lock (IR) Extension/ Rotation Test (50*) L    X   R    X   Passive Lumbar Lock (IR) Extension/ Rotation Test (50*) L    X   R    X  Active Prone UE pattern 2 test L    X   R    X  Passive Prone UE pattern 2 test L    X   R    X  Active Prone Shoulder 90/90 ER test (90* &/or total arc 150*) L X      R X     Passive Prone Shoulder 90/90 ER test (90* &/or total arc 150*) L -       R -      Active prone shoulder flexion/abduction test (180*) L X      R    X  Passive Prone Shoulder Flexion/abduction Test (1700*) L -       R X     Active Prone Elbow Flexion Test (shoulder flexed) L X      R X     Passive Prone Elbow Flexion Test (shoulder flexed) L -       R -        Multi-Segmental Flexion:  FN FP DP DN  Long sitting test    X - sacral angle limited, non-uniform spinal curve  Active SLR Test (70*)  L X - 80*      R X - 90*     Stabilized ASLR Test (70*) L       R      Passive SLR Test (80*) L       R      Supine Knee to Chest Test (120*)      Prone Rocking Test    X    Multi-Segmental Extension:  FN FP DP DN  Spine/Upper Body Flowchart  Prone Press Up Test    X - non-uniform spinal curve  Active Lumbar Lock (IR) Extension/ Rotation Test (50*) L    X   R    X  Passive Lumbar Lock (IR) Extension/ Rotation Test (50*) L    X   R    X  Active Prone on Elbow Extension/Rotation Test (30*) L    -   R    -  Passive Prone on Elbow Extension/Rotation Test (30*) L    -   R    -  Active Prone Shoulder Girdle Flexion Test (170*) L X      R X     Passive Prone Shoulder Girdle Flexion Test (170*) L -       R -      Lower Body Flowchart  FABER L    X   R    X  Stabilized FABER L   X - center LBP    R   X - center LBP   Modified Thomas Test L    X - knee <90*, slight abd   R    X - knee <90*, slight abd  Active Hip Extension Test (10*) L       R      Passive Hip Extension Test (10*) L       R        Multi-Segmental Rotation: Spine/Upper Body Flowchart   FN FP DP DN  Seated Torso Rotation Test (50*) L    X   R    X  Active Lumbar  Lock (IR) Extension/ Rotation Test (50*) L    X   R    X  Passive Lumbar Lock (IR) Extension/ Rotation Test (50*) L    X   R    X  Active Prone on Elbow Extension/Rotation Test (30*) L    -   R    -  Passive Prone on Elbow Extension/Rotation Test (30*) L    -   R    -  Lower Quarter Flowchart   FN FP DP DN  Active Prone Hip ER Test (40*) L    36*   R      Active Prone Hip IR Test (30*) L    29*   R      Stabilized Prone Hip ER Test (40*) L    X   R      Stabilized Prone Hip IR Test (30*) L     X   R      Passive Prone Hip ER Test (40*) L 47*      R      Passive Prone Hip ER Test (30*) L 40*      R      Active Seated Tibial ER Test (20*)  L       R      Active Seated Tibial IR Test (20*) L       R  Passive Tibial ER Test (20*) L       R      Passive Tibial IR Test (20*) L       R        Single-Leg Stance: Vestibular & Core   FN FP DP DN  Vestibular Test - CTSIB (Static Head on foam) (EO > EC)      CTSIB (Dynamic Head Movement on foam) (EO > EC)      Half-kneeling Narrow Base Test L       R      Quadruped Diagonals Test L       R      Ankle Flowchart  Active Tandem DF - Knee Extended Test  L       R      Passive Prone DF - Knee Extended Test (20*) L       R      Active Tandem PF Test (40*) L       R      Passive Prone PF Test (40*) L       R      Active Seated Ankle Inversion Test  L       R      Passive Ankle Inversion Test  L       R      Active Ankle Eversion Test L       R      Passive Ankle Eversion Test L       R        Arms Down Deep Squat:  FN FP DP DN  Active Tandem DF - Knee Flexed Test (40*) L       R      Passive Prone DF - Knee Flexed Test (30*) L       R      Active Seated Ankle Inversion/Eversion Test  L       R      Passive Seated Ankle Inversion/Eversion Test L       R      Supine Knees to Chest Holding Shins Test (120*)       Supine Knees to Chest Holding Thighs Test (120*)      Active Seated Hip IR Test (30*) L       R       Active Seated Hip ER Test (40*) L       R      Passive Seated Hip IR Test (30*) L       R      Passive Seated Hip ER Test (40*) L       R             PT Education - 04/08/17 0932    Education provided  Yes    Education Details  [facility] Top Tier and Advertising account executive) Educated  Patient    Methods  Explanation;Demonstration    Comprehension  Verbalized understanding;Returned demonstration       PT Short Term Goals - 04/06/17 1658      PT SHORT TERM GOAL #1   Title  Pt will be independent with HEP and perform consistently in order to maximize return to PLOF.    Time  2    Period  Weeks    Status  New    Target Date  04/20/17      PT SHORT TERM GOAL #2   Title  Pt will have improved MMT to  at least 4+/5 or > throughout proximal hip musculature to decrease pain and maximize gait.    Time  2    Period  Weeks    Status  New        PT Long Term Goals - 04/06/17 1659      PT LONG TERM GOAL #1   Title  Pt will have improved lumbar extension AROM by 25% or > to decrease pain and maximize overall function.    Time  4    Period  Weeks    Status  New    Target Date  05/04/17      PT LONG TERM GOAL #2   Title  Pt will have report no pain with active hip extension or piriformis flexibility testing in order to demo improved overall soft tissue restrictions and decrease overall c/o pain.    Time  4    Period  Weeks    Status  New      PT LONG TERM GOAL #3   Title  Pt will report being able to wake up without pain 4/7 mornings or > to demo improved overall function.    Time  4    Period  Weeks    Status  New      PT LONG TERM GOAL #4   Title  Pt will report being able to return to her normal exercise routine at the [place] at least 3x/week without exacerbations in pain to demo improved overall function and return to PLOF.     Time  4    Period  Weeks    Status  New      PT LONG TERM GOAL #5   Title  Pt will have no DP's during the [facility] Top Tier to  demo improved mobility and overall quality of movement in order to decrease pain and maximize overall QOL.    Time  4    Period  Weeks    Status  New            Plan - 04/08/17 0859    Clinical Impression Statement  Took pt through [facility] Top Tier and breakouts this date. Pt reported that MSF and ADDS caused her LBP/bil posterior thigh pain. Breakouts identified that she has mobility deficits of cervical and thoracic spine, and some mobility and stability deficits of bil hips. Pt educated on regional interdependence afterwards. Will continue breakouts next session and begin addressing mobility deficits once completed.     Rehab Potential  Good    PT Frequency  2x / week    PT Duration  4 weeks    PT Treatment/Interventions  ADLs/Self Care Home Management;Cryotherapy;Electrical Stimulation;Moist Heat;Gait training;Stair training;Functional mobility training;Therapeutic activities;Therapeutic exercise;Balance training;Neuromuscular re-education;Patient/family education;Manual techniques;Passive range of motion;Dry needling;Taping    PT Next Visit Plan  continue breakouts and begin addressing mobility dysfunctions identified    PT Home Exercise Plan  eval: supine piriformis stretch    Consulted and Agree with Plan of Care  Patient       Patient will benefit from skilled therapeutic intervention in order to improve the following deficits and impairments:  Decreased activity tolerance, Decreased range of motion, Decreased strength, Hypomobility, Increased fascial restricitons, Increased muscle spasms, Improper body mechanics, Postural dysfunction, Pain  Visit Diagnosis: Pain in right leg  Pain in left leg  Muscle weakness (generalized)  Other symptoms and signs involving the musculoskeletal system     Problem List Patient Active Problem List   Diagnosis Date Noted  .  Diabetes ([place]) 08/01/2015  . Hypertension 06/30/2012  . Reflux 06/30/2012  . Abscess of upper gum 06/30/2012  .  Obesity 06/30/2012      Geraldine Solar PT, [place] [address] [place], [place], [address] Phone: [phone]   Fax:  [phone]  Name: Susan Quinn MRN: [id number] Date of Birth: [birth year]

## 2017-04-08 NOTE — Telephone Encounter (Signed)
Patient has asked for a refill of Tramadol  Please advise pended

## 2017-04-08 NOTE — Telephone Encounter (Signed)
I sent refill request for Susan Quinn Tramadol to Dr Aline Brochure  I have called Susan Quinn to check on the MRI, does she want to try again with Valium ?  Or try open scan, will have to redo the approvals for a different scanner, this will take time since she has 2 insurance companies, and they both had to be appealed in order to get the approval

## 2017-04-09 ENCOUNTER — Ambulatory Visit: Payer: BLUE CROSS/BLUE SHIELD | Admitting: Orthopedic Surgery

## 2017-04-09 MED ORDER — TRAMADOL HCL 50 MG PO TABS: 50.0000 mg | ORAL_TABLET | Freq: Four times a day (QID) | ORAL | 0 refills | Status: DC

## 2017-04-09 NOTE — Telephone Encounter (Signed)
Patient confirmed, cancellation of appointment 04/09/2017 per notes 04/08/17

## 2017-04-09 NOTE — Telephone Encounter (Signed)
Ok dont do it

## 2017-04-13 ENCOUNTER — Ambulatory Visit (HOSPITAL_COMMUNITY): Payer: PRIVATE HEALTH INSURANCE

## 2017-04-13 ENCOUNTER — Encounter (HOSPITAL_COMMUNITY): Payer: Self-pay

## 2017-04-13 DIAGNOSIS — M79605 Pain in left leg: Secondary | ICD-10-CM

## 2017-04-13 DIAGNOSIS — M6281 Muscle weakness (generalized): Secondary | ICD-10-CM

## 2017-04-13 DIAGNOSIS — M79604 Pain in right leg: Secondary | ICD-10-CM | POA: Diagnosis not present

## 2017-04-13 DIAGNOSIS — R29898 Other symptoms and signs involving the musculoskeletal system: Secondary | ICD-10-CM

## 2017-04-13 NOTE — Therapy (Signed)
[facility] [address] [place], [place], [address] Phone: [phone]   Fax:  [phone]  Physical Therapy Treatment  Patient Details  Name: Susan Quinn MRN: [id number] Date of Birth: 29-Mar-1961 Referring Provider: Arther Abbott, MD   Encounter Date: 04/13/2017  PT End of Session - 04/13/17 1033    Visit Number  3    Number of Visits  9    Date for PT Re-Evaluation  05/04/17    Authorization Type  [employer] (Secondary: BCBS Other)    Authorization Time Period  04/06/17 to 05/04/17    PT Start Time  1033    PT Stop Time  1120    PT Time Calculation (min)  47 min    Activity Tolerance  Patient tolerated treatment well;No increased pain    Behavior During Therapy  WFL for tasks assessed/performed       Past Medical History:  Diagnosis Date  . Anemia   . Bowel obstruction ([place])   . Fibroids    history of  . Fibroids   . Hypertension   . Infection of mouth 2014  . Obesity 06/30/2012  . Reflux 06/30/2012  . Sleep apnea   . Sleep apnea     Past Surgical History:  Procedure Laterality Date  .  bowel obstruction    . ABDOMINAL HYSTERECTOMY    . BREAST SURGERY    . CESAREAN SECTION    . COLONOSCOPY N/A 10/18/2015   Procedure: COLONOSCOPY;  Surgeon: Rogene Houston, MD;  [facility];  Service: Endoscopy;  Laterality: N/A;  915  . excision of scar    . REDUCTION MAMMAPLASTY Bilateral 10+ years ago    There were no vitals filed for this visit.  Subjective Assessment - 04/13/17 1034    Subjective  Pt states that she's feeling good today. She did feel some discomfort in her back following last session but nothing too bad. She is currently having a little pain bil posterior thighs.    How long can you sit comfortably?  no issues    How long can you stand comfortably?  unlimited    How long can you walk comfortably?  will ignore the pain and keep going    Diagnostic tests  had MRI scheduled; x-ray completed     Patient Stated Goals  get back active (cardio, classes, pickle ball)    Currently in Pain?  Yes    Pain Location  Leg    Pain Orientation  Posterior;Upper    Pain Descriptors / Indicators  Aching    Pain Type  Chronic pain    Pain Onset  More than a month ago    Pain Frequency  Intermittent        SFMA Top Tier:   FN FP DP DN  Cervical Flexion    X  Cervical Extension    X  Cervical Rotation L    X   R    X  Upper Extremity 1 (MRE) L     X   R    X  Upper Extremity 2 (LRF) L    X   R    X  Multi-Segmental Flexion   X - reported bil HS/posterior thigh pain   Multi-Segmental Extension    X  Multi-Segmental Rotation L    X   R    X  Single-Leg Stance L    X - EC <10 sec   R  X - EC <10 sec  Arms Down Deep Squat   X - reported bil HS/posterior thigh pain     SFMA Breakouts:  Cervical Flexion:   FN FP DP DN  Active Supine Cervical Flexion Test (chin to chest)    X  Passive Supine Cervical Flexion Test L    X   R    X  Active OA Cervical Flexion Test (20*)  L    X   R    X     Cervical Extension:  FN FP DP DN  Supine Cervical Extension    X     Cervical Rotation:  FN FP DP DN  Active Supine Cervical Rotation Test (80*) L     X   R    X  Passive Supine Cervical Rotation Test (80*) L    X   R    X  C1-C2 Cervical Rotation Test (40*)  L    X - very close to 40    R    X   - Pt with cervical mobility dysfunction throughout   Upper Extremity Pattern 1 (MRE):  FN FP DP DN  Active Lumbar Lock (IR) Extension/ Rotation Test (50*) L    X   R    X  Passive Lumbar Lock (IR) Extension/ Rotation Test (50*) L    X   R    X  Active Prone UE pattern 1 test L    x   R    X  Passive Prone UE pattern 1 test L    X   R    X  Active Prone Shoulder 90/90 IR test (60* &/or total arc 150*) L    X   R X     Passive Prone Shoulder 90/90 IR test (60* &/or total arc 150*) L 62*      R -      Active prone shoulder  extension test (50*) L    X   R    X  Passive Prone  Shoulder Extension Test (50*) L  X     R    X  Active Prone Elbow Flexion Test (shoulder extended) L    X   R X     Passive Prone Elbow Flexion Test (shoulder extended) L X      R -       - Thoracic spine extension/rotation mobility dysfunction - SMCD L GH IR and elbow flexion with shoulder extended - MD of GH extension bil   Upper Extremity Pattern 2 (LRF):   FN   FP  DP DN   Active Lumbar Lock (IR) Extension/ Rotation Test (50*) L    X   R    X  Passive Lumbar Lock (IR) Extension/ Rotation Test (50*) L    X   R    X  Active Prone UE pattern 2 test L    X   R    X  Passive Prone UE pattern 2 test L    X   R    X  Active Prone Shoulder 90/90 ER test (90* &/or total arc 150*) L X      R X     Passive Prone Shoulder 90/90 ER test (90* &/or total arc 150*) L -       R -      Active prone shoulder flexion/abduction test (180*) L X      R  X  Passive Prone Shoulder Flexion/abduction Test (170*) L -       R X     Active Prone Elbow Flexion Test (shoulder flexed) L X      R X     Passive Prone Elbow Flexion Test (shoulder flexed) L -       R -       - Thoracic spine extension/rotation mobility dysfunction - SMCD R GH flex/abd    Multi-Segmental Flexion:  FN FP DP DN  Long sitting test     X - sacral angle limited, non-uniform spinal curve  Active SLR Test (70*) L X - 80*      R X - 90*     Stabilized ASLR Test (70*) L -      R -     Passive SLR Test (80*) L -      R -     Supine Knee to Chest Test (120*) -     Prone Rocking Test    X  - Pt's hip flexion and posterior chain are WNL - MD of lumbar flexion ROM   Multi-Segmental Extension:   FN  F P  DP   DN  Spine/Upper Body Flowchart    Prone Press Up Test       X - non-uniform spinal curve  Active Lumbar Lock (IR)  Extension/ Rotation Test (50*)  L    X   R    X  Passive Lumbar Lock (IR) Extension/ Rotation Test (50*)  L    X   R    X  Active Prone on Elbow Extension/Rotation Test (30*) L    -   R    -   Passive Prone on Elbow Extension/Rotation Test (30*) L    -   R    -  Active Prone Shoulder Girdle Flexion Test (170*) L X      R X     Passive Prone Shoulder Girdle Flexion Test (170*) L -       R -      Lower Body Flowchart  FABER L    X   R    X  Stabilized FABER L   X - center LBP    R   X - center LBP   Modified Thomas Test L    X - knee <90*, slight abd   R    X - knee <90*, slight abd  Active Hip Extension Test (10*) L       R      Passive Hip Extension Test (10*) L       R       - Hip Extension pain/dysfunction and mobility dysfunction BLE   Multi-Segmental Rotation: Spine/Upper Body Flowchart   FN FP DP DN  Seated Torso Rotation Test (50*) L    X   R    X  Active Lumbar Lock (IR) Extension/ Rotation Test (50*) L    X   R    X  Passive Lumbar Lock (IR) Extension/ Rotation Test (50*) L    X   R    X  Active Prone on Elbow Extension/Rotation Test (30*) L    -   R    -  Passive Prone on Elbow Extension/Rotation Test (30*) L    -   R     -  Lower Quarter Flowchart   FN FP DP DN  Active Prone Hip ER Test (40*) L  60*      R    27*  Active Prone Hip IR Test (30*) L 33*      R 31*     Stabilized Prone Hip ER Test (40*) L       R 41*     Stabilized Prone Hip IR Test (30*) L -      R -     Passive Prone Hip ER Test (40*) L -      R -     Passive Prone Hip IR Test (30*) L -      R -     Active Seated Tibial ER Test (20*)   L -      R    X  Active Seated Tibial IR Test (20*) L X      R    X  Passive Tibial ER Test (20*) L -      R    X  Passive Tibial IR Test (20*) L -      R    X   - L hip WNL in prone; L tibial IR/ER WNL seated - SMCD R hip ER - R tibial IR/ER MD  Single-Leg Stance: Vestibular & Core   FN FP DP DN  Vestibular Test - CTSIB (Static Head on foam) (EO > EC) X     CTSIB (Dynamic Head Movement on foam) (EO > EC) X     Half-kneeling Narrow Base Test L X      R    X  Quadruped Diagonals Test L X      R X     Ankle Flowchart  Active  Tandem DF - Knee Extended Test  L    X   R    X  Passive Prone DF - Knee Extended Test (20*) L    +10*   R    +5*  Active Tandem PF Test (40*) L    X - close to 40*   R    X - close to 40*  Passive Prone PF Test (40*) L 65*      R 60*     Active Seated Ankle Inversion Test  L    X   R    X  Passive Ankle Inversion Test  L    X   R    X - R stiffer than L  Active Ankle Eversion Test L    X   R    X  Passive Ankle Eversion Test L    X   R    X - R stiffer than L  - Bil ankle MD throughout (except PF), but R much stiffer than L throughout - SMCD bil PF  Arms Down Deep Squat:  FN FP DP DN  Active Tandem DF - Knee Flexed Test (40*) L    X   R    X  Passive Prone DF - Knee Flexed Test (30*) L     ~+10*   R    ~5*  Active Seated Ankle Inversion/Eversion Test  L    X   R    X  Passive Seated Ankle Inversion/Eversion Test L    X - R stiffer than L   R    X - R stiffer than L  Supine Knees to Chest Holding Shins Test (120*)  X     Supine Knees to Chest Holding Thighs Test (120*)  -  Active Seated Hip IR Test (30*) L    21*   R    30*  Active Seated Hip ER Test (40*) L    29*   R    14*  Passive Seated Hip IR Test (30*) L    35*   R    26*  Passive Seated Hip ER Test (40*) L    18*   R    18*   - MD of bil ankles and bil hips in sitting         [facility] Adult PT Treatment/Exercise - 04/13/17 0001      Exercises   Exercises  Knee/Hip      Knee/Hip Exercises: Stretches   Gastroc Stretch  Both;1 rep;20 seconds    Gastroc Stretch Limitations  standing at wall    Other Knee/Hip Stretches  bil UT and LS stretching 2x20" each    Other Knee/Hip Stretches  2D thoracic excursions x10 reps each (hold on extension as it recreated her bil thigh pain, reassess next visit)              PT Education - 04/13/17 1037    Education provided  Yes    Education Details  SFMA breakouts and its findings; will address cervical thoracic and ankle mobility dysfunctions    Person(s)  Educated  Patient    Methods  Explanation;Demonstration    Comprehension  Verbalized understanding;Returned demonstration       PT Short Term Goals - 04/06/17 1658      PT SHORT TERM GOAL #1   Title  Pt will be independent with HEP and perform consistently in order to maximize return to PLOF.    Time  2    Period  Weeks    Status  New    Target Date  04/20/17      PT SHORT TERM GOAL #2   Title  Pt will have improved MMT to at least 4+/5 or > throughout proximal hip musculature to decrease pain and maximize gait.    Time  2    Period  Weeks    Status  New        PT Long Term Goals - 04/06/17 1659      PT LONG TERM GOAL #1   Title  Pt will have improved lumbar extension AROM by 25% or > to decrease pain and maximize overall function.    Time  4    Period  Weeks    Status  New    Target Date  05/04/17      PT LONG TERM GOAL #2   Title  Pt will have report no pain with active hip extension or piriformis flexibility testing in order to demo improved overall soft tissue restrictions and decrease overall c/o pain.    Time  4    Period  Weeks    Status  New      PT LONG TERM GOAL #3   Title  Pt will report being able to wake up without pain 4/7 mornings or > to demo improved overall function.    Time  4    Period  Weeks    Status  New      PT LONG TERM GOAL #4   Title  Pt will report being able to return to her normal exercise routine at the [facility] at least 3x/week without exacerbations in pain to demo improved overall function and return to PLOF.     Time  4    Period  Weeks    Status  New      PT LONG TERM GOAL #5   Title  Pt will have no DP's during the [facility] Top Tier to demo improved mobility and overall quality of movement in order to decrease pain and maximize overall QOL.    Time  4    Period  Weeks    Status  New            Plan - 04/13/17 1126    Clinical Impression Statement  Completed SFMA breakouts this session. Detailed findings provided  above. Pt most notably presents with mobility dysfunctions of cervical and thoracic spine and bil ankle DF, R>L. Pt also had +FABER and reported recreation of LBP with this. Pt's R ankle much stiffer throughout compared to L and pt stating that she tends to have more issues with her RLE and stated "I'm bigger on my right side." She demo'd increased stiffness of R UT and R LS during cervical stretching and she also stated that thoracic extension (during 3D thoracic excursions) caused her bil posterior thigh pain. Will begin addressing mobility dysfunctions next visit.    Rehab Potential  Good    PT Frequency  2x / week    PT Duration  4 weeks    PT Treatment/Interventions  ADLs/Self Care Home Management;Cryotherapy;Electrical Stimulation;Moist Heat;Gait training;Stair training;Functional mobility training;Therapeutic activities;Therapeutic exercise;Balance training;Neuromuscular re-education;Patient/family education;Manual techniques;Passive range of motion;Dry needling;Taping    PT Next Visit Plan  Review new HEP from 1/15; perform cervical and thoracic joint mobs (central and/or unilateral), STM to cervical and thoracic paraspinals as needed; begin cervical excursions, sidelying thoracic rotation (hips 90/90), seated thoracic extension over 1/2 foam roll, ankle mobility (R>L) i.e. stretching, mobs, BAPS, ABCs, etc.    PT Home Exercise Plan  eval: supine piriformis stretch; 1/15: UT and LS stretching, thoracic excursions, standing calf stretch    Consulted and Agree with Plan of Care  Patient       Patient will benefit from skilled therapeutic intervention in order to improve the following deficits and impairments:  Decreased activity tolerance, Decreased range of motion, Decreased strength, Hypomobility, Increased fascial restricitons, Increased muscle spasms, Improper body mechanics, Postural dysfunction, Pain  Visit Diagnosis: Pain in right leg  Pain in left leg  Muscle weakness  (generalized)  Other symptoms and signs involving the musculoskeletal system     Problem List Patient Active Problem List   Diagnosis Date Noted  . Diabetes ([place]) 08/01/2015  . Hypertension 06/30/2012  . Reflux 06/30/2012  . Abscess of upper gum 06/30/2012  . Obesity 06/30/2012      Geraldine Solar PT, [facility] [address] [place], [place], [address] Phone: [phone]   Fax:  [phone]  Name: Susan Quinn MRN: [id number] Date of Birth: [birth year]

## 2017-04-13 NOTE — Patient Instructions (Signed)
  UPPER TRAP STRETCH - HOLDING CHAIR  While sitting in a chair, hold the seat with one hand and bend your head towards the opposite side for a gentle stretch to the side of the neck.  Perform 1x/day, 3-5 stretches holding for 30-60 seconds each.   Levator scap stretch   Begin by sitting straight up, then turn your head to one side and then down towards your armpit. A stretch should be felt in the back of neck down towards the shoulder blades.   Perform 1x/day, 3-5 stretches holding for 30-60 seconds each.   STANDING CALF STRETCH  - GASTROCNEMIUS  Start by standing in front of a wall or other sturdy object. Step forward with one foot and maintain your toes on both feet to be pointed straight forward. Keep the leg behind you with a straight knee during the stretch.   Lean forward towards the wall and support yourself with your arms as you allow your front knee to bend until a gentle stretch is felt along the back of your leg that is most behind you.   Move closer or further away from the wall to control the stretch of the back leg. Also you can adjust the bend of the front knee to control the stretch as well.   Perform 1x/day, 3-5 stretches holding for 30-60 seconds each.

## 2017-04-15 ENCOUNTER — Encounter (HOSPITAL_COMMUNITY): Payer: Self-pay | Admitting: Physical Therapy

## 2017-04-15 ENCOUNTER — Other Ambulatory Visit: Payer: Self-pay

## 2017-04-15 ENCOUNTER — Ambulatory Visit (HOSPITAL_COMMUNITY): Payer: PRIVATE HEALTH INSURANCE | Admitting: Physical Therapy

## 2017-04-15 DIAGNOSIS — R29898 Other symptoms and signs involving the musculoskeletal system: Secondary | ICD-10-CM

## 2017-04-15 DIAGNOSIS — M79605 Pain in left leg: Secondary | ICD-10-CM

## 2017-04-15 DIAGNOSIS — M79604 Pain in right leg: Secondary | ICD-10-CM | POA: Diagnosis not present

## 2017-04-15 DIAGNOSIS — M6281 Muscle weakness (generalized): Secondary | ICD-10-CM

## 2017-04-15 NOTE — Therapy (Signed)
[facility] [address] [place], [place], [address] Phone: [phone]   Fax:  [phone]  Physical Therapy Treatment  Patient Details  Name: KIAJA SHORTY MRN: [id number] Date of Birth: 1960/08/15 Referring Provider: Arther Abbott, MD   Encounter Date: 04/15/2017  PT End of Session - 04/15/17 1108    Visit Number  4    Number of Visits  9    Date for PT Re-Evaluation  05/04/17    Authorization Type  [employer] (Secondary: BCBS Other)    Authorization Time Period  04/06/17 to 05/04/17    PT Start Time  0907    PT Stop Time  0946    PT Time Calculation (min)  39 min    Activity Tolerance  Patient tolerated treatment well;No increased pain    Behavior During Therapy  WFL for tasks assessed/performed       Past Medical History:  Diagnosis Date  . Anemia   . Bowel obstruction ([place])   . Fibroids    history of  . Fibroids   . Hypertension   . Infection of mouth 2014  . Obesity 06/30/2012  . Reflux 06/30/2012  . Sleep apnea   . Sleep apnea     Past Surgical History:  Procedure Laterality Date  .  bowel obstruction    . ABDOMINAL HYSTERECTOMY    . BREAST SURGERY    . CESAREAN SECTION    . COLONOSCOPY N/A 10/18/2015   Procedure: COLONOSCOPY;  Surgeon: Rogene Houston, MD;  [facility];  Service: Endoscopy;  Laterality: N/A;  915  . excision of scar    . REDUCTION MAMMAPLASTY Bilateral 10+ years ago    There were no vitals filed for this visit.  Subjective Assessment - 04/15/17 1105    Subjective  Patient reported that she is not having any pain this session or throughout the session. Patient reported that she is mostly just still having issues in her glute region, but is not having pain today.     How long can you sit comfortably?  no issues    How long can you stand comfortably?  unlimited    How long can you walk comfortably?  will ignore the pain and keep going    Diagnostic tests  had MRI scheduled; x-ray  completed    Patient Stated Goals  get back active (cardio, classes, pickle ball)    Currently in Pain?  No/denies                      [facility] Adult PT Treatment/Exercise - 04/15/17 0001      Knee/Hip Exercises: Stretches   Gastroc Stretch  Both;2 reps;30 seconds    Gastroc Stretch Limitations  standing at wall    Other Knee/Hip Stretches  bilateral upper trap and levator scapulae stretching 2x20" each    Other Knee/Hip Stretches  Thoracic openers in sidelying x 10 reps for 10 second holds each side      Knee/Hip Exercises: Standing   Other Standing Knee Exercises  Baps board on right lower extremity with bilateral upper extremity support x 2 minutes forward and back and 2 minutes from side to side      Knee/Hip Exercises: Seated   Other Seated Knee/Hip Exercises  Performed seated movement of right ankle forward and back on rocker board x 50 repetitions side to side x 50 repetitions for overall improvement of right ankle mobility      Manual  Therapy   Manual Therapy  Joint mobilization    Manual therapy comments  Completed separately from all other skilled interventions    Joint Mobilization  Performed posterior to anterior grade III mobilizations to cervical spine C2-C7 oscillating for 10 seconds with each mobilization, and on right side focused on performing up glides at each joint for increased mobility x 15 minutes total              PT Education - 04/15/17 1107    Education provided  Yes    Education Details  Patient was educated on the purpose of each exercise and technique throughout session.     Person(s) Educated  Patient    Methods  Explanation;Demonstration    Comprehension  Verbalized understanding;Returned demonstration       PT Short Term Goals - 04/06/17 1658      PT SHORT TERM GOAL #1   Title  Pt will be independent with HEP and perform consistently in order to maximize return to PLOF.    Time  2    Period  Weeks    Status  New    Target  Date  04/20/17      PT SHORT TERM GOAL #2   Title  Pt will have improved MMT to at least 4+/5 or > throughout proximal hip musculature to decrease pain and maximize gait.    Time  2    Period  Weeks    Status  New        PT Long Term Goals - 04/06/17 1659      PT LONG TERM GOAL #1   Title  Pt will have improved lumbar extension AROM by 25% or > to decrease pain and maximize overall function.    Time  4    Period  Weeks    Status  New    Target Date  05/04/17      PT LONG TERM GOAL #2   Title  Pt will have report no pain with active hip extension or piriformis flexibility testing in order to demo improved overall soft tissue restrictions and decrease overall c/o pain.    Time  4    Period  Weeks    Status  New      PT LONG TERM GOAL #3   Title  Pt will report being able to wake up without pain 4/7 mornings or > to demo improved overall function.    Time  4    Period  Weeks    Status  New      PT LONG TERM GOAL #4   Title  Pt will report being able to return to her normal exercise routine at the [facility] at least 3x/week without exacerbations in pain to demo improved overall function and return to PLOF.     Time  4    Period  Weeks    Status  New      PT LONG TERM GOAL #5   Title  Pt will have no DP's during the [facility] Top Tier to demo improved mobility and overall quality of movement in order to decrease pain and maximize overall QOL.    Time  4    Period  Weeks    Status  New            Plan - 04/15/17 1109    Clinical Impression Statement  This session focused on progressing patient through plan created by patient's therapist from last 3 sessions who is  certified in [facility]. Initially performed grade III joint mobilizations from posterior to anterior on spinous processes as described above in order to improve mobility of cervical spine in order to address noted resistance contributing to symptoms in lower extremities. Incorporated thoracic openers to also address  thoracic mobility. Patient performed self stretches of cervical spine and then progressed to improving mobility of the ankles particularly the right ankle again in order to address patient's symptoms in the right lower extremity. Patient performed standing stretches and then performed seated ankle mobility followed by standing ankle mobility. Patient tolerated all activities well and denied pain throughout session. Patient would benefit from continued skilled physical therapy in order to address current deficits in mobility contributing to lower extremity symptoms.     History and Personal Factors relevant to plan of care:  young, active individual, first time dealing with this injury; HTN    Clinical Presentation  Stable    Rehab Potential  Good    PT Frequency  2x / week    PT Duration  4 weeks    PT Treatment/Interventions  ADLs/Self Care Home Management;Cryotherapy;Electrical Stimulation;Moist Heat;Gait training;Stair training;Functional mobility training;Therapeutic activities;Therapeutic exercise;Balance training;Neuromuscular re-education;Patient/family education;Manual techniques;Passive range of motion;Dry needling;Taping    PT Next Visit Plan  Review new HEP from 1/15; perform cervical and thoracic joint mobs (central and/or unilateral), STM to cervical and thoracic paraspinals as needed; begin cervical excursions, sidelying thoracic rotation (hips 90/90), seated thoracic extension over 1/2 foam roll, ankle mobility (R>L) i.e. stretching, continue with right ankle mobility baps, and seated mobility exercise    PT Home Exercise Plan  eval: supine piriformis stretch; 1/15: UT and LS stretching, thoracic excursions, standing calf stretch    Consulted and Agree with Plan of Care  Patient       Patient will benefit from skilled therapeutic intervention in order to improve the following deficits and impairments:  Decreased activity tolerance, Decreased range of motion, Decreased strength,  Hypomobility, Increased fascial restricitons, Increased muscle spasms, Improper body mechanics, Postural dysfunction, Pain  Visit Diagnosis: Pain in right leg  Pain in left leg  Muscle weakness (generalized)  Other symptoms and signs involving the musculoskeletal system     Problem List Patient Active Problem List   Diagnosis Date Noted  . Diabetes ([place]) 08/01/2015  . Hypertension 06/30/2012  . Reflux 06/30/2012  . Abscess of upper gum 06/30/2012  . Obesity 06/30/2012   Clarene Critchley PT, DPT 12:16 PM, 04/15/17 [place], [place], [address] Phone: [phone]   Fax:  [phone]  Name: SHARREN SCHNURR MRN: [id number] Date of Birth: February 06, 1961

## 2017-04-20 ENCOUNTER — Encounter (HOSPITAL_COMMUNITY): Payer: Self-pay

## 2017-04-20 ENCOUNTER — Ambulatory Visit (HOSPITAL_COMMUNITY): Payer: PRIVATE HEALTH INSURANCE

## 2017-04-20 DIAGNOSIS — M79605 Pain in left leg: Secondary | ICD-10-CM

## 2017-04-20 DIAGNOSIS — R29898 Other symptoms and signs involving the musculoskeletal system: Secondary | ICD-10-CM

## 2017-04-20 DIAGNOSIS — M6281 Muscle weakness (generalized): Secondary | ICD-10-CM

## 2017-04-20 DIAGNOSIS — M79604 Pain in right leg: Secondary | ICD-10-CM | POA: Diagnosis not present

## 2017-04-20 NOTE — Therapy (Signed)
[facility] [address] [place], [place], [address] Phone: [phone]   Fax:  [phone]  Physical Therapy Treatment  Patient Details  Name: Susan Quinn MRN: [id number] Date of Birth: Jun 09, 1960 Referring Provider: Arther Abbott, MD   Encounter Date: 04/20/2017  PT End of Session - 04/20/17 0914    Visit Number  5    Number of Visits  9    Date for PT Re-Evaluation  05/04/17    Authorization Type  [employer] (Secondary: BCBS Other)    Authorization Time Period  04/06/17 to 05/04/17    PT Start Time  0902    PT Stop Time  0955    PT Time Calculation (min)  53 min    Activity Tolerance  Patient tolerated treatment well;No increased pain    Behavior During Therapy  WFL for tasks assessed/performed       Past Medical History:  Diagnosis Date  . Anemia   . Bowel obstruction ([place])   . Fibroids    history of  . Fibroids   . Hypertension   . Infection of mouth 2014  . Obesity 06/30/2012  . Reflux 06/30/2012  . Sleep apnea   . Sleep apnea     Past Surgical History:  Procedure Laterality Date  .  bowel obstruction    . ABDOMINAL HYSTERECTOMY    . BREAST SURGERY    . CESAREAN SECTION    . COLONOSCOPY N/A 10/18/2015   Procedure: COLONOSCOPY;  Surgeon: Rogene Houston, MD;  [facility];  Service: Endoscopy;  Laterality: N/A;  915  . excision of scar    . REDUCTION MAMMAPLASTY Bilateral 10+ years ago    There were no vitals filed for this visit.  Subjective Assessment - 04/20/17 0911    Subjective  Pt stated she had some pain following last session from butt ending just above posterior knee.  No reports of current pain and has been compliant with HEP mainly focusing on neck stretches.      Patient Stated Goals  get back active (cardio, classes, pickle ball)    Currently in Pain?  No/denies                      [facility] Adult PT Treatment/Exercise - 04/20/17 0001      Knee/Hip Exercises: Stretches   Piriformis Stretch  1 rep 1 set supine then seated    Other Knee/Hip Stretches  bilateral upper trap and levator scapulae stretching 2x20" each      Knee/Hip Exercises: Standing   Rocker Board  2 minutes DF/PF and lateral    Other Standing Knee Exercises  BAPS L3 10 reps DF/PF; Inv/Ev; CW/CCw 5x each      Knee/Hip Exercises: Seated   Other Seated Knee/Hip Exercises  Cervical and thoracic excrusion (with UE movement) 5-10 reps each    Other Seated Knee/Hip Exercises  thoracic extension on foam      Knee/Hip Exercises: Sidelying   Other Sidelying Knee/Hip Exercises  Thoracic rotation 10x each side      Manual Therapy   Manual Therapy  Soft tissue mobilization    Manual therapy comments  Completed separately from all other skilled interventions    Soft tissue mobilization  Supine position STM to cervical paraspinals and UT; position release technqiue for Rt UT             PT Education - 04/20/17 0914    Education provided  Yes  Education Details  Educated on anatomy of piriformis musculature and effects of tightness     Person(s) Educated  Patient    Methods  Explanation    Comprehension  Verbalized understanding;Returned demonstration       PT Short Term Goals - 04/06/17 1658      PT SHORT TERM GOAL #1   Title  Pt will be independent with HEP and perform consistently in order to maximize return to PLOF.    Time  2    Period  Weeks    Status  New    Target Date  04/20/17      PT SHORT TERM GOAL #2   Title  Pt will have improved MMT to at least 4+/5 or > throughout proximal hip musculature to decrease pain and maximize gait.    Time  2    Period  Weeks    Status  New        PT Long Term Goals - 04/06/17 1659      PT LONG TERM GOAL #1   Title  Pt will have improved lumbar extension AROM by 25% or > to decrease pain and maximize overall function.    Time  4    Period  Weeks    Status  New    Target Date  05/04/17      PT LONG TERM GOAL #2   Title  Pt will  have report no pain with active hip extension or piriformis flexibility testing in order to demo improved overall soft tissue restrictions and decrease overall c/o pain.    Time  4    Period  Weeks    Status  New      PT LONG TERM GOAL #3   Title  Pt will report being able to wake up without pain 4/7 mornings or > to demo improved overall function.    Time  4    Period  Weeks    Status  New      PT LONG TERM GOAL #4   Title  Pt will report being able to return to her normal exercise routine at the [facility] at least 3x/week without exacerbations in pain to demo improved overall function and return to PLOF.     Time  4    Period  Weeks    Status  New      PT LONG TERM GOAL #5   Title  Pt will have no DP's during the [facility] Top Tier to demo improved mobility and overall quality of movement in order to decrease pain and maximize overall QOL.    Time  4    Period  Weeks    Status  New            Plan - 04/20/17 1001    Clinical Impression Statement  Session focus on spinal and ankle mobility and manual techniques to address soft tissue restrictions in cervical and thoracic musculature.  Began session with spinal excusion and passive/AAROM thoracic extension.  Pt educated on benefits of stretches to address radicular symptoms on proximal posterior thigh, able to demonstrate appropriate mechanics iwht piriformis stretch and was educated on seated stretch as well to improve compliance.  No reports of pain through session.      Rehab Potential  Good    PT Frequency  2x / week    PT Duration  4 weeks    PT Treatment/Interventions  ADLs/Self Care Home Management;Cryotherapy;Electrical Stimulation;Moist Heat;Gait training;Stair training;Functional mobility training;Therapeutic activities;Therapeutic  exercise;Balance training;Neuromuscular re-education;Patient/family education;Manual techniques;Passive range of motion;Dry needling;Taping    PT Next Visit Plan  perform cervical and thoracic  joint mobs (central and/or unilateral), STM to cervical and thoracic paraspinals as needed; begin cervical excursions, sidelying thoracic rotation (hips 90/90), seated thoracic extension over 1/2 foam roll, ankle mobility (R>L) i.e. stretching, continue with right ankle mobility baps, and seated mobility exercise    PT Home Exercise Plan  eval: supine piriformis stretch; 1/15: UT and LS stretching, thoracic excursions, standing calf stretch       Patient will benefit from skilled therapeutic intervention in order to improve the following deficits and impairments:  Decreased activity tolerance, Decreased range of motion, Decreased strength, Hypomobility, Increased fascial restricitons, Increased muscle spasms, Improper body mechanics, Postural dysfunction, Pain  Visit Diagnosis: Pain in right leg  Pain in left leg  Muscle weakness (generalized)  Other symptoms and signs involving the musculoskeletal system     Problem List Patient Active Problem List   Diagnosis Date Noted  . Diabetes ([place]) 08/01/2015  . Hypertension 06/30/2012  . Reflux 06/30/2012  . Abscess of upper gum 06/30/2012  . Obesity 06/30/2012   Ihor Austin, [place]; [place]  Aldona Lento 04/20/2017, 10:09 AM  [facility] [address] [place], [place], [address] Phone: [phone]   Fax:  [phone]  Name: Susan Quinn MRN: [id number] Date of Birth: 02-04-1961

## 2017-04-22 ENCOUNTER — Telehealth (HOSPITAL_COMMUNITY): Payer: Self-pay | Admitting: Internal Medicine

## 2017-04-22 ENCOUNTER — Ambulatory Visit (HOSPITAL_COMMUNITY): Payer: PRIVATE HEALTH INSURANCE

## 2017-04-22 NOTE — Telephone Encounter (Signed)
04/22/17  pt called and said she would be unable to make it in today

## 2017-04-27 ENCOUNTER — Ambulatory Visit (HOSPITAL_COMMUNITY): Payer: PRIVATE HEALTH INSURANCE

## 2017-04-27 ENCOUNTER — Encounter (HOSPITAL_COMMUNITY): Payer: Self-pay

## 2017-04-27 DIAGNOSIS — M79605 Pain in left leg: Secondary | ICD-10-CM

## 2017-04-27 DIAGNOSIS — M79604 Pain in right leg: Secondary | ICD-10-CM

## 2017-04-27 DIAGNOSIS — R29898 Other symptoms and signs involving the musculoskeletal system: Secondary | ICD-10-CM

## 2017-04-27 DIAGNOSIS — M6281 Muscle weakness (generalized): Secondary | ICD-10-CM

## 2017-04-27 NOTE — Patient Instructions (Addendum)
  Sidelying Thoracic Rotation  Lying on side, hips and knees at 90 degrees. Rotate hand and head backwards until stretch is felt in mid back area.  Lie facing the wall and keep top knee touching the wall as you rotate your arm backwards  Perform 1x/day, 10-15 reps holding for 10-15 seconds on each side.   Foam roll Thoracic Extension arms crossed  Seated in a chair with foam roller (1/2 foam roll, rolled up towel, or two tennis balls in sock) on a given point of your thoracic vertebrae and cross your arms. Using the foam roll as a fulcrum extend trunk over roll. Initiate movement from chest neck head or neck. Be aware of low back as well, it will want to compensate for poor mobility in trunk. Keep as engaged and only allow movement in thoracic region. Repeat as instructed. THEN move roll up or down and repeat sequence.   Use 2-3 beach towels rolled up long ways -- perform 5-10 times holding for 5-10 seconds at multiple different segments

## 2017-04-27 NOTE — Therapy (Signed)
[facility] [address] [place], [place], [address] Phone: [phone]   Fax:  [phone]  Physical Therapy Treatment  Patient Details  Name: Susan Quinn MRN: [id number] Date of Birth: 05-22-1960 Referring Provider: Arther Abbott, MD   Encounter Date: 04/27/2017  PT End of Session - 04/27/17 0900    Visit Number  6    Number of Visits  9    Date for PT Re-Evaluation  05/04/17    Authorization Type  Med[employer] (Secondary: BCBS Other)    Authorization Time Period  04/06/17 to 05/04/17    PT Start Time  0900    PT Stop Time  0942    PT Time Calculation (min)  42 min    Activity Tolerance  Patient tolerated treatment well;No increased pain    Behavior During Therapy  WFL for tasks assessed/performed       Past Medical History:  Diagnosis Date  . Anemia   . Bowel obstruction ([place])   . Fibroids    history of  . Fibroids   . Hypertension   . Infection of mouth 2014  . Obesity 06/30/2012  . Reflux 06/30/2012  . Sleep apnea   . Sleep apnea     Past Surgical History:  Procedure Laterality Date  .  bowel obstruction    . ABDOMINAL HYSTERECTOMY    . BREAST SURGERY    . CESAREAN SECTION    . COLONOSCOPY N/A 10/18/2015   Procedure: COLONOSCOPY;  Surgeon: Rogene Houston, MD;  [facility];  Service: Endoscopy;  Laterality: N/A;  915  . excision of scar    . REDUCTION MAMMAPLASTY Bilateral 10+ years ago    There were no vitals filed for this visit.  Subjective Assessment - 04/27/17 0900    Subjective  Pt states that she feels pretty good. She states that she's ready to test the waters and try exercising again.     Patient Stated Goals  get back active (cardio, classes, pickle ball)    Currently in Pain?  No/denies            [facility] Adult PT Treatment/Exercise - 04/27/17 0001      Knee/Hip Exercises: Stretches   Other Knee/Hip Stretches  bilateral upper trap and levator scapulae stretching 2x20" each      Knee/Hip Exercises: Seated   Other Seated Knee/Hip Exercises  thoracic extension over foam roll 5x5" holds at each segment      Knee/Hip Exercises: Sidelying   Other Sidelying Knee/Hip Exercises  thoracic rotation with hips 90/90 10x10" holds      Manual Therapy   Manual Therapy  Soft tissue mobilization;Joint mobilization    Manual therapy comments  Completed separately from all other skilled interventions    Joint Mobilization  Central PAs and R unilateral PAs T2/3-12    Soft tissue mobilization  supine manual upper trap stretch 2x30" bil; supine manual OA rotation stretch 1x30" each; STM to distal R thoracic paraspinals           PT Education - 04/27/17 0900    Education provided  Yes    Education Details  mobility work; updated HEP    Person(s) Educated  Patient    Methods  Explanation;Demonstration    Comprehension  Verbalized understanding;Returned demonstration       PT Short Term Goals - 04/06/17 1658      PT SHORT TERM GOAL #1   Title  Pt will be independent with HEP and  perform consistently in order to maximize return to PLOF.    Time  2    Period  Weeks    Status  New    Target Date  04/20/17      PT SHORT TERM GOAL #2   Title  Pt will have improved MMT to at least 4+/5 or > throughout proximal hip musculature to decrease pain and maximize gait.    Time  2    Period  Weeks    Status  New        PT Long Term Goals - 04/06/17 1659      PT LONG TERM GOAL #1   Title  Pt will have improved lumbar extension AROM by 25% or > to decrease pain and maximize overall function.    Time  4    Period  Weeks    Status  New    Target Date  05/04/17      PT LONG TERM GOAL #2   Title  Pt will have report no pain with active hip extension or piriformis flexibility testing in order to demo improved overall soft tissue restrictions and decrease overall c/o pain.    Time  4    Period  Weeks    Status  New      PT LONG TERM GOAL #3   Title  Pt will report being able to  wake up without pain 4/7 mornings or > to demo improved overall function.    Time  4    Period  Weeks    Status  New      PT LONG TERM GOAL #4   Title  Pt will report being able to return to her normal exercise routine at the [facility] at least 3x/week without exacerbations in pain to demo improved overall function and return to PLOF.     Time  4    Period  Weeks    Status  New      PT LONG TERM GOAL #5   Title  Pt will have no DP's during the [facility] Top Tier to demo improved mobility and overall quality of movement in order to decrease pain and maximize overall QOL.    Time  4    Period  Weeks    Status  New            Plan - 04/27/17 0944    Clinical Impression Statement  Continued with addressing mobility deficits in cervical and thoracic spine this date. Pt with increased hypomobility of central and R unilateral PAs of thoracic spine and reported that palpation to distal R thoracic paraspinals recreated her leg pain. STM to this area resolved referred pain. Provided pt with thoracic mobility exercises to perform at home to continue to promote improved mobility. Pt progressing nicely as she has not had her pain in about the last week. Continue as planned.    Rehab Potential  Good    PT Frequency  2x / week    PT Duration  4 weeks    PT Treatment/Interventions  ADLs/Self Care Home Management;Cryotherapy;Electrical Stimulation;Moist Heat;Gait training;Stair training;Functional mobility training;Therapeutic activities;Therapeutic exercise;Balance training;Neuromuscular re-education;Patient/family education;Manual techniques;Passive range of motion;Dry needling;Taping    PT Next Visit Plan  perform cervical and thoracic joint mobs (central and/or unilateral), STM to cervical and thoracic paraspinals as needed; begin cervical excursions, trial supine thoracic extension over 1/2 foam roll, ankle mobility (R>L) i.e. stretching, continue with right ankle mobility baps, and seated mobility  exercise  PT Home Exercise Plan  eval: supine piriformis stretch; 1/15: UT and LS stretching, thoracic excursions, standing calf stretch; 1/29: sidelyign thoracic rotation, seated thoracic ext over towel rolls    Consulted and Agree with Plan of Care  Patient       Patient will benefit from skilled therapeutic intervention in order to improve the following deficits and impairments:  Decreased activity tolerance, Decreased range of motion, Decreased strength, Hypomobility, Increased fascial restricitons, Increased muscle spasms, Improper body mechanics, Postural dysfunction, Pain  Visit Diagnosis: Pain in right leg  Pain in left leg  Muscle weakness (generalized)  Other symptoms and signs involving the musculoskeletal system     Problem List Patient Active Problem List   Diagnosis Date Noted  . Diabetes ([place]) 08/01/2015  . Hypertension 06/30/2012  . Reflux 06/30/2012  . Abscess of upper gum 06/30/2012  . Obesity 06/30/2012      Geraldine Solar PT, [facility] [address] [place], [place], [address] Phone: [phone]   Fax:  [phone]  Name: Susan Quinn MRN: [id number] Date of Birth: Jul 22, 1960

## 2017-04-29 ENCOUNTER — Ambulatory Visit (HOSPITAL_COMMUNITY): Payer: PRIVATE HEALTH INSURANCE

## 2017-04-29 ENCOUNTER — Encounter (HOSPITAL_COMMUNITY): Payer: Self-pay

## 2017-04-29 DIAGNOSIS — M79604 Pain in right leg: Secondary | ICD-10-CM | POA: Diagnosis not present

## 2017-04-29 DIAGNOSIS — M6281 Muscle weakness (generalized): Secondary | ICD-10-CM

## 2017-04-29 DIAGNOSIS — M79605 Pain in left leg: Secondary | ICD-10-CM

## 2017-04-29 DIAGNOSIS — R29898 Other symptoms and signs involving the musculoskeletal system: Secondary | ICD-10-CM

## 2017-04-29 NOTE — Therapy (Signed)
[facility] [address] [place], [place], [address] Phone: [phone]   Fax:  [phone]  Physical Therapy Treatment  Patient Details  Name: Susan Quinn MRN: [id number] Date of Birth: [birth year] Referring Provider: Arther Abbott, MD   Encounter Date: 04/29/2017  PT End of Session - 04/29/17 1309    Visit Number  7    Number of Visits  9    Date for PT Re-Evaluation  05/04/17    Authorization Type  Med[employer] (Secondary: BCBS Other)    Authorization Time Period  04/06/17 to 05/04/17    PT Start Time  1309 pt late    Activity Tolerance  Patient tolerated treatment well;No increased pain    Behavior During Therapy  WFL for tasks assessed/performed       Past Medical History:  Diagnosis Date  . Anemia   . Bowel obstruction ([place])   . Fibroids    history of  . Fibroids   . Hypertension   . Infection of mouth 2014  . Obesity 06/30/2012  . Reflux 06/30/2012  . Sleep apnea   . Sleep apnea     Past Surgical History:  Procedure Laterality Date  .  bowel obstruction    . ABDOMINAL HYSTERECTOMY    . BREAST SURGERY    . CESAREAN SECTION    . COLONOSCOPY N/A 10/18/2015   Procedure: COLONOSCOPY;  Surgeon: Rogene Houston, MD;  [facility];  Service: Endoscopy;  Laterality: N/A;  915  . excision of scar    . REDUCTION MAMMAPLASTY Bilateral 10+ years ago    There were no vitals filed for this visit.  Subjective Assessment - 04/29/17 1309    Subjective  Pt states that she worked out yesterday (cardio, tai chi, pickle ball) for the first time since mid-November. She did not have any issues during or afterwards. Her back is just a little tender but she thinks that's from the manual therapy last visit.    Patient Stated Goals  get back active (cardio, classes, pickle ball)    Currently in Pain?  No/denies            [facility] Adult PT Treatment/Exercise - 04/29/17 0001      Knee/Hip Exercises: Standing   Other Standing  Knee Exercises  Y's on wall with liftoff x20 reps      Knee/Hip Exercises: Supine   Other Supine Knee/Hip Exercises  thoracic extension over 1/2 foam roll 5x10" holds at each segment      Knee/Hip Exercises: Prone   Other Prone Exercises  quadruped thoracic rotations x10 reps each      Manual Therapy   Manual Therapy  Soft tissue mobilization;Joint mobilization    Manual therapy comments  Completed separately from all other skilled interventions    Joint Mobilization  Central PAs and R unilateral PAs T2/3-12    Soft tissue mobilization  STM to distal thoracic paraspinals bil           PT Short Term Goals - 04/06/17 1658      PT SHORT TERM GOAL #1   Title  Pt will be independent with HEP and perform consistently in order to maximize return to PLOF.    Time  2    Period  Weeks    Status  New    Target Date  04/20/17      PT SHORT TERM GOAL #2   Title  Pt will have improved MMT to at least 4+/5  or > throughout proximal hip musculature to decrease pain and maximize gait.    Time  2    Period  Weeks    Status  New        PT Long Term Goals - 04/06/17 1659      PT LONG TERM GOAL #1   Title  Pt will have improved lumbar extension AROM by 25% or > to decrease pain and maximize overall function.    Time  4    Period  Weeks    Status  New    Target Date  05/04/17      PT LONG TERM GOAL #2   Title  Pt will have report no pain with active hip extension or piriformis flexibility testing in order to demo improved overall soft tissue restrictions and decrease overall c/o pain.    Time  4    Period  Weeks    Status  New      PT LONG TERM GOAL #3   Title  Pt will report being able to wake up without pain 4/7 mornings or > to demo improved overall function.    Time  4    Period  Weeks    Status  New      PT LONG TERM GOAL #4   Title  Pt will report being able to return to her normal exercise routine at the [facility] at least 3x/week without exacerbations in pain to demo  improved overall function and return to PLOF.     Time  4    Period  Weeks    Status  New      PT LONG TERM GOAL #5   Title  Pt will have no DP's during the [facility] Top Tier to demo improved mobility and overall quality of movement in order to decrease pain and maximize overall QOL.    Time  4    Period  Weeks    Status  New            Plan - 04/29/17 1431    Clinical Impression Statement  Pt presenting to therapy reporting she was able to workout with no issues. Continued with established POC focusing on thoracic mobility. Pt had bout of L upper abdominal pain after thoracic extensions over 1/2 foam roll; she stated it felt like something was rolling under. PT inquired if it could be gas and the pt then began burping; after a few mins she stated the pain was gone; feel this could have been triggered by pt lying supine and extending over foam roll and aggravating her GERD. Ended with continued thoracic mobility work and pt reporting no pain at EOS. Pt due for reassessment next visit and will likely be discharged.    Rehab Potential  Good    PT Frequency  2x / week    PT Duration  4 weeks    PT Treatment/Interventions  ADLs/Self Care Home Management;Cryotherapy;Electrical Stimulation;Moist Heat;Gait training;Stair training;Functional mobility training;Therapeutic activities;Therapeutic exercise;Balance training;Neuromuscular re-education;Patient/family education;Manual techniques;Passive range of motion;Dry needling;Taping    PT Next Visit Plan  reassess; [facility] Top Tier    PT Home Exercise Plan  eval: supine piriformis stretch; 1/15: UT and LS stretching, thoracic excursions, standing calf stretch; 1/29: sidelyign thoracic rotation, seated thoracic ext over towel rolls    Consulted and Agree with Plan of Care  Patient       Patient will benefit from skilled therapeutic intervention in order to improve the following deficits and impairments:  Decreased activity tolerance, Decreased range of  motion, Decreased strength, Hypomobility, Increased fascial restricitons, Increased muscle spasms, Improper body mechanics, Postural dysfunction, Pain  Visit Diagnosis: Pain in right leg  Pain in left leg  Muscle weakness (generalized)  Other symptoms and signs involving the musculoskeletal system     Problem List Patient Active Problem List   Diagnosis Date Noted  . Diabetes ([place]) 08/01/2015  . Hypertension 06/30/2012  . Reflux 06/30/2012  . Abscess of upper gum 06/30/2012  . Obesity 06/30/2012       Geraldine Solar PT, [facility] [address] [place], [place], [address] Phone: [phone]   Fax:  [phone]  Name: Susan Quinn MRN: [id number] Date of Birth: Mar 31, 1960

## 2017-05-04 ENCOUNTER — Encounter (HOSPITAL_COMMUNITY): Payer: Self-pay

## 2017-05-04 ENCOUNTER — Ambulatory Visit (HOSPITAL_COMMUNITY): Payer: PRIVATE HEALTH INSURANCE | Attending: Orthopedic Surgery

## 2017-05-04 DIAGNOSIS — M79605 Pain in left leg: Secondary | ICD-10-CM | POA: Insufficient documentation

## 2017-05-04 DIAGNOSIS — R29898 Other symptoms and signs involving the musculoskeletal system: Secondary | ICD-10-CM | POA: Insufficient documentation

## 2017-05-04 DIAGNOSIS — M6281 Muscle weakness (generalized): Secondary | ICD-10-CM | POA: Insufficient documentation

## 2017-05-04 DIAGNOSIS — M79604 Pain in right leg: Secondary | ICD-10-CM | POA: Insufficient documentation

## 2017-05-04 NOTE — Patient Instructions (Signed)
  wall slides shoulder flexion with lift off  place forearms on wall, shoulders at 90 degrees in front of you slowly slide arms up wall without moving your lower back, keeping your head level and neck relaxed only move arms as far as able to maintain posture and without pain, at the top of the motion, lift arms off wall, moving them backward by tilting shoulder blades back and down   THORACIC ROTATION - QUADRUPED  While in a crawl position, lower your buttock a little towards your feet to get in a lower position as shown.   Next, with a hand behind your head, rotate your body and your head to the side, then return.      Kneeling Thoracic Extension and Lat Stretch  Equipment: Table, Bed, Chair, Bench, Box  Description: To do Kneeling Thoracic Extension and Lat stretch, kneel in front of the bench, table, box, etc., and place your elbow on the bench shoulder-width apart.  Kneel far enough from the bench that you have room to sit back and drop your chest through your arm to extend your spine.    Perform mobility work before and/or after your gym workout -- 10-15 mins worth  Stretch and just stay mobile on your off days. (can stretch prior to/after gym as well)

## 2017-05-04 NOTE — Therapy (Signed)
[place], [place], [address] Phone: [phone]   Fax:  [phone]  Physical Therapy Treatment/Discharge Summary  Patient Details  Name: Susan Quinn MRN: [id number] Date of Birth: [birth year] Referring Provider: Arther Abbott, MD   Encounter Date: 05/04/2017  PT End of Session - 05/04/17 0905    Visit Number  8    Number of Visits  9    Date for PT Re-Evaluation  05/04/17    Authorization Type  [employer] (Secondary: BCBS Other)    Authorization Time Period  04/06/17 to 05/04/17    PT Start Time  0903    PT Stop Time  0927    PT Time Calculation (min)  24 min    Activity Tolerance  Patient tolerated treatment well;No increased pain    Behavior During Therapy  WFL for tasks assessed/performed       Past Medical History:  Diagnosis Date  . Anemia   . Bowel obstruction ([place])   . Fibroids    history of  . Fibroids   . Hypertension   . Infection of mouth 2014  . Obesity 06/30/2012  . Reflux 06/30/2012  . Sleep apnea   . Sleep apnea     Past Surgical History:  Procedure Laterality Date  .  bowel obstruction    . ABDOMINAL HYSTERECTOMY    . BREAST SURGERY    . CESAREAN SECTION    . COLONOSCOPY N/A 10/18/2015   Procedure: COLONOSCOPY;  Surgeon: Rogene Houston, MD;  Location: [facility];  Service: Endoscopy;  Laterality: N/A;  915  . excision of scar    . REDUCTION MAMMAPLASTY Bilateral 10+ years ago    There were no vitals filed for this visit.  Subjective Assessment - 05/04/17 0906    Subjective  Pt states that she has been able to workout with no complaints. She has not had her leg pain for a while now.     Patient Stated Goals  get back active (cardio, classes, pickle ball)    Currently in Pain?  No/denies         [facility] PT Assessment - 05/04/17 0001      AROM   Lumbar Extension  WFL was 50% limited, increased pain      Strength   Right Hip Extension  4+/5 was 4    Right Hip ABduction   4+/5 was 4    Left Hip Extension  4+/5 was 4    Left Hip ABduction  4+/5 was 4-    Right Knee Flexion  5/5 was 4    Left Knee Flexion  5/5 was 4      Flexibility   Piriformis  WFL was tight BLE        SFMA Top Tier:   FN FP DP DN  Cervical Flexion    X  Cervical Extension X     Cervical Rotation L    X   R    X  Upper Extremity 1 (MRE) L     X   R    X  Upper Extremity 2 (LRF) L    X   R    X  Multi-Segmental Flexion    X  Multi-Segmental Extension    X  Multi-Segmental Rotation L    X   R    X  Single-Leg Stance L    X   R    X  Arms Down Deep Squat  X       PT Education - 05/04/17 0906    Education provided  Yes    Education Details  updated HEP, discharge plans    Person(s) Educated  Patient    Methods  Explanation;Handout    Comprehension  Verbalized understanding       PT Short Term Goals - 05/04/17 0907      PT SHORT TERM GOAL #1   Title  Pt will be independent with HEP and perform consistently in order to maximize return to PLOF.    Time  2    Period  Weeks    Status  Achieved      PT SHORT TERM GOAL #2   Title  Pt will have improved MMT to at least 4+/5 or > throughout proximal hip musculature to decrease pain and maximize gait.    Time  2    Period  Weeks    Status  Achieved        PT Long Term Goals - 05/04/17 0907      PT LONG TERM GOAL #1   Title  Pt will have improved lumbar extension AROM by 25% or > to decrease pain and maximize overall function.    Time  4    Period  Weeks    Status  Achieved      PT LONG TERM GOAL #2   Title  Pt will have report no pain with active hip extension or piriformis flexibility testing in order to demo improved overall soft tissue restrictions and decrease overall c/o pain.    Time  4    Period  Weeks    Status  Achieved      PT LONG TERM GOAL #3   Title  Pt will report being able to wake up without pain 4/7 mornings or > to demo improved overall function.    Baseline  2/5: not waking up with  pain any mornings; can wake up and keep going without needing a break    Time  4    Period  Weeks    Status  Achieved      PT LONG TERM GOAL #4   Title  Pt will report being able to return to her normal exercise routine at the [facility] at least 3x/week without exacerbations in pain to demo improved overall function and return to PLOF.     Baseline  2/5: has returned to normal routine but has not been able to go 3x/week due to her busy schedule    Time  4    Period  Weeks    Status  Partially Met      PT LONG TERM GOAL #5   Title  Pt will have no DP's during the [facility] Top Tier to demo improved mobility and overall quality of movement in order to decrease pain and maximize overall QOL.    Time  4    Period  Weeks    Status  Achieved            Plan - 05/04/17 0932    Clinical Impression Statement  PT reassessed pt's goals and outcome measures this date. Pt has made tremendous progress towards goals as illustrated above. Pt's overall strength is at least 4+/5 or >, her overall flexibility has improved, and her reports of pain has resolved. Pt reporting that she feels 95% improved, stating the last 5% is that she is a little nervous that it may return; PT encouraged pt to  continue her mobility HEP either prior to and/or after her gym routines and that she should be fine, however, if it does return she can come back with a referral. Pt scored all DN's with one FN on SFMA Top Tier this date, a great improvement from her initial assessment as she had 2 DP's at that time. Pt given updated thoracic mobility HEP and is ready for discharge at this time.     Rehab Potential  Good    PT Frequency  2x / week    PT Duration  4 weeks    PT Treatment/Interventions  ADLs/Self Care Home Management;Cryotherapy;Electrical Stimulation;Moist Heat;Gait training;Stair training;Functional mobility training;Therapeutic activities;Therapeutic exercise;Balance training;Neuromuscular re-education;Patient/family  education;Manual techniques;Passive range of motion;Dry needling;Taping    PT Next Visit Plan  discharged    PT Home Exercise Plan  eval: supine piriformis stretch; 1/15: UT and LS stretching, thoracic excursions, standing calf stretch; 1/29: sidelyign thoracic rotation, seated thoracic ext over towel rolls; 2/5: kneeling thoracic extension, quadruped thoracic rotation, Y's on wall with liftoff    Consulted and Agree with Plan of Care  Patient       Patient will benefit from skilled therapeutic intervention in order to improve the following deficits and impairments:  Decreased activity tolerance, Decreased range of motion, Decreased strength, Hypomobility, Increased fascial restricitons, Increased muscle spasms, Improper body mechanics, Postural dysfunction, Pain  Visit Diagnosis: Pain in right leg  Pain in left leg  Muscle weakness (generalized)  Other symptoms and signs involving the musculoskeletal system     Problem List Patient Active Problem List   Diagnosis Date Noted  . Diabetes ([place]) 08/01/2015  . Hypertension 06/30/2012  . Reflux 06/30/2012  . Abscess of upper gum 06/30/2012  . Obesity 06/30/2012     PHYSICAL THERAPY DISCHARGE SUMMARY  Visits from Start of Care: 8  Current functional level related to goals / functional outcomes: See above   Remaining deficits: See above   Education / Equipment: HEP Plan: Patient agrees to discharge.  Patient goals were met. Patient is being discharged due to meeting the stated rehab goals.  ?????       Geraldine Solar PT, [facility] [address] [place], [place], [address] Phone: [phone]   Fax:  [phone]  Name: Susan Quinn MRN: [id number] Date of Birth: 1960/06/09

## 2017-07-04 ENCOUNTER — Other Ambulatory Visit: Payer: Self-pay | Admitting: Orthopedic Surgery

## 2017-11-25 ENCOUNTER — Encounter: Payer: Self-pay | Admitting: Orthopedic Surgery

## 2017-11-26 ENCOUNTER — Encounter: Payer: Self-pay | Admitting: Orthopedic Surgery

## 2017-11-26 ENCOUNTER — Ambulatory Visit (INDEPENDENT_AMBULATORY_CARE_PROVIDER_SITE_OTHER): Payer: PRIVATE HEALTH INSURANCE | Admitting: Orthopedic Surgery

## 2017-11-26 VITALS — BP 159/90 | HR 96 | Ht 65.0 in | Wt 248.0 lb

## 2017-11-26 DIAGNOSIS — Z6841 Body Mass Index (BMI) 40.0 and over, adult: Secondary | ICD-10-CM | POA: Diagnosis not present

## 2017-11-26 DIAGNOSIS — M76829 Posterior tibial tendinitis, unspecified leg: Secondary | ICD-10-CM

## 2017-11-26 NOTE — Patient Instructions (Addendum)
Posterior Tibialis Tendinosis Posterior tibialis tendinosis is irritation and degeneration of a tendon called the posterior tibial tendon. Your posterior tibial tendon is a cord-like tissue that connects bones of your lower leg and foot to a muscle that:  Supports your arch.  Helps you raise up on your toes.  Helps you turn your foot down and in.  This condition causes foot and ankle pain and can lead to a flat foot. What are the causes? This condition is most often caused by repeated stress to the tendon (overuse injury). It can also be caused by a sudden injury that stresses the tendon, such as landing on your foot after jumping or falling. What increases the risk? This condition is more likely to develop in:  People who play a sport that involves putting a lot of pressure on the feet, such as: ? Basketball. ? Tennis. ? Soccer. ? Hockey.  Runners.  Females who are older than 40 years and are overweight.  People with diabetes.  People with decreased foot stability (ligamentous laxity).  People with flat feet.  What are the signs or symptoms? Symptoms of this condition may start suddenly or gradually. Symptoms include:  Pain in the inner ankle.  Pain at the arch of your foot.  Pain that gets worse with running, walking, or standing.  Swelling on the inside of your ankle and foot.  Weakness in your ankle or foot.  Inability to stand up on tiptoe.  How is this diagnosed? This condition may be diagnosed based on:  Your symptoms.  Your medical history.  A physical exam.  Tests, such as: ? An X-ray. ? MRI. ? An ultrasound.  During the physical exam, your health care provider may move your foot and ankle, test your strength and balance, and check the arch of your foot while you stand or walk. How is this treated? This condition may be treated by:  Replacing high-impact exercise with low-impact exercise, such as swimming or cycling.  Applying ice to the  injured area.  Taking an anti-inflammatory pain medicine.  Physical therapy.  Wearing a special shoe or shoe insert to support your arch (orthotic).  If your symptoms do not improve with these treatments, you may need to wear a splint, removable walking boot, or short leg cast for 6-8 weeks to keep your foot and ankle still. Follow these instructions at home: If you have a boot or splint:  Wear the boot or splint as told by your health care provider. Remove it only as told by your health care provider.  Do not use your foot to support (bear) your full body weight until your health care provider says that you can.  Loosen the boot or splint if your toes tingle, become numb, or turn cold and blue.  Keep the boot or splint clean.  If your boot or splint is not waterproof: ? Do not let it get wet. ? Cover it with a watertight plastic bag when you take a bath or shower. If you have a cast:  Do not stick anything inside the cast to scratch your skin. Doing that increases your risk of infection.  Check the skin around the cast every day. Tell your health care provider about any concerns.  You may put lotion on dry skin around the edges of the cast. Do not apply lotion to the skin underneath the cast.  Keep the cast clean.  Do not take baths, swim, or use a hot tub until your health care  provider approves. Ask your health care provider if you can take showers. You may only be allowed to take sponge baths for bathing.  If your cast is not waterproof: ? Do not let it get wet. ? Cover it with a watertight plastic bag while you take a bath or a shower. Managing pain and swelling  Take over-the-counter and prescription medicines only as told by your health care provider.  If directed, apply ice to the injured area: ? Put ice in a plastic bag. ? Place a towel between your skin and the bag. ? Leave the ice on for 20 minutes, 2-3 times a day.  Raise (elevate) your ankle above the level  of your heart when resting if you have swelling. Activity  Do not do activities that make pain or swelling worse.  Return to full activity gradually as symptoms improve.  Do exercises as told by your health care provider. General instructions  If you have an orthotic, use it as told by your health care provider.  Keep all follow-up visits as told by your health care provider. This is important. How is this prevented?  Wear footwear that is appropriate to your athletic activity.  Avoid athletic activities that cause pain or swelling in your ankle or foot.  Before being active, do range-of-motion and stretching exercises.  If you develop pain or swelling while training, stop training.  If you have pain or swelling that does not improve after a few days of rest, see your health care provider.  If you start a new athletic activity, start gradually so you can build up your strength and flexibility. Contact a health care provider if:  Your symptoms get worse.  Your symptoms do not improve in 6-8 weeks.  You develop new, unexplained symptoms.  Your splint, boot, or cast gets damaged. This information is not intended to replace advice given to you by your health care provider. Make sure you discuss any questions you have with your health care provider. Document Released: 03/16/2005 Document Revised: 11/19/2015 Document Reviewed: 11/30/2014 Elsevier Interactive Patient Education  2018 Elsevier Inc.   SALT SOAKS 20 MIN 2 X  A DAY  NO WEIGHT BEARING  IBUPROFEN  FU 1 WEEK

## 2017-11-26 NOTE — Progress Notes (Signed)
Progress Note   Patient ID: Susan Quinn, female   DOB: 1960-06-24, 57 y.o.   MRN: [id number]   Chief Complaint  Patient presents with  . Ankle Pain    right     HPI The patient presents for evaluation of her right ankle.  She has taken up golf.  She says she was golfing and then the next day she started having ankle pain but it became more intense on Tuesday she went to urgent care x-rays were taken there was no acute fracture.  She was placed in a Cam walker crutches ibuprofen and sent here for follow-up.  She says that the cam walker is causing more pain  Location pain along the medial malleolus and arch Duration 3 days Quality burning ache Severity moderate Associated with painful weightbearing and supination deformity of the foot  Review of Systems  Constitutional: Negative for fever.  Musculoskeletal: Positive for back pain.  Skin: Negative.   Neurological: Negative for tingling.   Current Meds  Medication Sig  . diclofenac (VOLTAREN) 75 MG EC tablet Take 1 tablet by mouth 2 (two) times daily as needed for mild pain.   . hydrochlorothiazide (HYDRODIURIL) 25 MG tablet TAKE 1 TABLET BY MOUTH EVERY DAY FOR BLOOD PRESSURE  . lisinopril (PRINIVIL,ZESTRIL) 10 MG tablet Take 10 mg by mouth daily.  . metFORMIN (GLUCOPHAGE) 500 MG tablet Take 500 mg 2 (two) times daily with a meal by mouth.    Past Medical History:  Diagnosis Date  . Anemia   . Bowel obstruction ([place])   . Fibroids    history of  . Fibroids   . Hypertension   . Infection of mouth 2014  . Obesity 06/30/2012  . Reflux 06/30/2012  . Sleep apnea   . Sleep apnea      No Known Allergies   BP (!) 159/90   Pulse 96   Ht 5\' 5"  (1.651 m)   Wt 248 lb (112.5 kg)   BMI 41.27 kg/m    Physical Exam  Constitutional: She is oriented to person, place, and time. She appears well-developed and well-nourished.  Neurological: She is alert and oriented to person, place, and time.  Psychiatric: She has a normal  mood and affect. Judgment normal.  Vitals reviewed.   Right Ankle Exam   Tenderness  The patient is experiencing tenderness in the deltoid (Posterior tibial tendon). Swelling: none  Range of Motion  Dorsiflexion: abnormal  Plantar flexion: 20  Eversion: 0  Inversion: 15   Muscle Strength  Right ankle normal muscle strength: Posterior tibial tendon weakness.  Tests  Anterior drawer: negative Varus tilt: negative  Other  Erythema: absent Scars: absent Sensation: normal Pulse: present    Left Ankle Exam  Left ankle exam is normal.  Tenderness  The patient is experiencing no tenderness.  Swelling: none  Range of Motion  The patient has normal left ankle ROM.   Muscle Strength  The patient has normal left ankle strength.  Tests  Anterior drawer: negative Varus tilt: negative  Other  Erythema: absent Scars: absent Sensation: normal Pulse: present      Arther Abbott, MD   MEDICAL DECISION MAKING   Imaging:  Imaging was on the disc 3 views of the ankle  My interpretation of the x-ray is at the x-ray does not show acute fracture ankle mortise is intact there is no arthritis  NEW PROBLEM  Encounter Diagnosis  Name Primary?  Marland Kitchen PTTD (posterior tibial tendon dysfunction) Yes  PLAN: (RX., injection, surgery,frx,mri/ct, XR 2 body ares) Nonweightbearing Epson salt soaks twice a day 20 minutes Ibuprofen Weight loss counseling The patient meets the AMA guidelines for Morbid (severe) obesity with a BMI > 40.0 and I have recommended weight loss. 1 week follow-up  No orders of the defined types were placed in this encounter.  9:26 AM 11/26/2017

## 2017-12-03 ENCOUNTER — Ambulatory Visit (INDEPENDENT_AMBULATORY_CARE_PROVIDER_SITE_OTHER): Payer: PRIVATE HEALTH INSURANCE | Admitting: Orthopedic Surgery

## 2017-12-03 ENCOUNTER — Encounter: Payer: Self-pay | Admitting: Orthopedic Surgery

## 2017-12-03 VITALS — BP 147/87 | HR 82 | Ht 65.0 in | Wt 248.0 lb

## 2017-12-03 DIAGNOSIS — M76829 Posterior tibial tendinitis, unspecified leg: Secondary | ICD-10-CM

## 2017-12-03 NOTE — Progress Notes (Signed)
Follow-up visit  Chief Complaint  Patient presents with  . Ankle Pain    right     Encounter Diagnosis  Name Primary?  Marland Kitchen PTTD (posterior tibial tendon dysfunction) Yes    The patient did not wear the boot for the insert of the boot soft black lining.  She says the foot is feeling better but she still walking on the side of the foot  So at this point we discussed our treatment plan and she is agreeable to wear the boot 2 to 3 weeks and transferred to [facility] and use a self-directed program  Time spent 15 minutes: Face-to-face counseling greater than 50% of the visit and to summarize the counseling provided:  1.  We went through the natural history of posterior tibial dysfunction and described the natural history of disease in the disease process including but not limited to progressive flatfoot deformity micro-tearing complete tearing and subsequent need for fusion  2.  Discussed the benefits of orthotic wear using a over-the-counter orthotic versus a custom  3.  Discussed the need to put the tendon at rest versus trying to walk on it and do normal activities at this time which would not allow rest and would continue the progressive tearing and inflammation    As noted prior her BMI is 41  The patient meets the AMA guidelines for Morbid (severe) obesity with a BMI > 40.0 and I have recommended weight loss.  Return as needed

## 2018-01-04 ENCOUNTER — Other Ambulatory Visit: Payer: Self-pay | Admitting: Internal Medicine

## 2018-01-04 DIAGNOSIS — Z1231 Encounter for screening mammogram for malignant neoplasm of breast: Secondary | ICD-10-CM

## 2018-03-17 ENCOUNTER — Ambulatory Visit
Admission: RE | Admit: 2018-03-17 | Discharge: 2018-03-17 | Disposition: A | Discharge status: Home / Self Care | Payer: PRIVATE HEALTH INSURANCE | Source: Ambulatory Visit | Attending: Internal Medicine | Admitting: Internal Medicine

## 2018-03-17 DIAGNOSIS — Z1231 Encounter for screening mammogram for malignant neoplasm of breast: Secondary | ICD-10-CM

## 2018-07-29 ENCOUNTER — Other Ambulatory Visit: Payer: Self-pay | Admitting: Orthopedic Surgery

## 2018-09-16 ENCOUNTER — Telehealth: Payer: Self-pay | Admitting: Orthopedic Surgery

## 2018-09-16 NOTE — Telephone Encounter (Signed)
Patient wants to schedule appointment with you for stiffness in head/neck, R shoulder and down R leg. States this has been going on for about 3 weeks now. I explained that I would send you this information and see if she needs to see [place] or go somewhere else. I suggested in the meantime she may want to see her PCP and be evaluated. I told her that as soon as I got a reply for my message I would give her a call.  Please advise

## 2018-09-16 NOTE — Telephone Encounter (Signed)
Scheduled

## 2018-09-16 NOTE — Telephone Encounter (Signed)
June 24th at 4 pm tell her to come in at 52 to fill out her paper work and dont be late

## 2018-09-17 ENCOUNTER — Emergency Department (HOSPITAL_COMMUNITY)
Admission: EM | Admit: 2018-09-17 | Discharge: 2018-09-17 | Disposition: A | Discharge status: Home / Self Care | Payer: PRIVATE HEALTH INSURANCE | Attending: Emergency Medicine | Admitting: Emergency Medicine

## 2018-09-17 ENCOUNTER — Other Ambulatory Visit: Payer: Self-pay

## 2018-09-17 ENCOUNTER — Encounter (HOSPITAL_COMMUNITY): Payer: Self-pay

## 2018-09-17 DIAGNOSIS — M542 Cervicalgia: Secondary | ICD-10-CM | POA: Diagnosis present

## 2018-09-17 DIAGNOSIS — E119 Type 2 diabetes mellitus without complications: Secondary | ICD-10-CM | POA: Insufficient documentation

## 2018-09-17 DIAGNOSIS — M62838 Other muscle spasm: Secondary | ICD-10-CM | POA: Diagnosis not present

## 2018-09-17 DIAGNOSIS — Z79899 Other long term (current) drug therapy: Secondary | ICD-10-CM | POA: Insufficient documentation

## 2018-09-17 DIAGNOSIS — Z7984 Long term (current) use of oral hypoglycemic drugs: Secondary | ICD-10-CM | POA: Diagnosis not present

## 2018-09-17 DIAGNOSIS — I10 Essential (primary) hypertension: Secondary | ICD-10-CM | POA: Insufficient documentation

## 2018-09-17 HISTORY — DX: Sciatica, unspecified side: M54.30

## 2018-09-17 MED ORDER — HYDROCODONE-ACETAMINOPHEN 5-325 MG PO TABS: 1.0000 | ORAL_TABLET | ORAL | 0 refills | Status: DC | PRN

## 2018-09-17 MED ORDER — KETOROLAC TROMETHAMINE 30 MG/ML IJ SOLN
30.0000 mg | Freq: Once | INTRAMUSCULAR | Status: AC
  Administered 2018-09-17: 23:00:00 30 mg via INTRAMUSCULAR
  Filled 2018-09-17: qty 1

## 2018-09-17 NOTE — ED Triage Notes (Addendum)
Pt reports right sided neck pain that started Tuesday, pt says pain started with sciatica pain (right lower back, down leg) then that evening the neck pain started. Pt reports seeing Dr [place] Crane today and given 2 pain shots. Pt has appt with Dr Aline Brochure next week. Pt has taken muscle relaxer, and 2 tylenol prior to arrival.

## 2018-09-17 NOTE — Discharge Instructions (Signed)
Continue taking your diclofenac and your methocarbamol.  You may take the hydrocodone prescribed for pain relief.  This will make you drowsy - do not drive within 4 hours of taking this medication.  Continue applying a heating pad to your neck followed by range of motion stretching as discussed 3 times daily to try getting this muscle to relax.

## 2018-09-18 NOTE — ED Provider Notes (Addendum)
[facility] EMERGENCY DEPARTMENT Provider Note   CSN: [id number] Arrival date & time: 09/17/18  2009     History   Chief Complaint Chief Complaint  Patient presents with  . Neck Pain    HPI Susan Quinn is a 58 y.o. female with a history of anemia, HTN and significant for chronic sciatica and osteoarthritis under the care of Dr. Aline Brochure presenting with a one week history of right sided neck pain and muscle spasm with decreased ability to rotate her head secondary to pain.  She was seen by her pcp this morning and was given 2 pain shots (does not know the names) and obtained relief, but is now having return of pain as these medicines wear off.  She has taken tylenol and robaxin prior to arrival without relief. She denies weakness or numbness in her arms or hands, denies headache, fever, chills.  She does have active sciatica pain as well, right sided which is a chronic finding.  She is anticipating appt with Dr. Aline Brochure this week. She denies trauma.    The history is provided by the patient.    Past Medical History:  Diagnosis Date  . Anemia   . Bowel obstruction ([place])   . Fibroids    history of  . Fibroids   . Hypertension   . Infection of mouth 2014  . Obesity 06/30/2012  . Reflux 06/30/2012  . Sciatica   . Sleep apnea   . Sleep apnea     Patient Active Problem List   Diagnosis Date Noted  . Diabetes ([place]) 08/01/2015  . Hypertension 06/30/2012  . Reflux 06/30/2012  . Abscess of upper gum 06/30/2012  . Obesity 06/30/2012    Past Surgical History:  Procedure Laterality Date  .  bowel obstruction    . ABDOMINAL HYSTERECTOMY    . BREAST SURGERY    . CESAREAN SECTION    . COLONOSCOPY N/A 10/18/2015   Procedure: COLONOSCOPY;  Surgeon: Rogene Houston, MD;  Location: [facility];  Service: Endoscopy;  Laterality: N/A;  915  . excision of scar    . REDUCTION MAMMAPLASTY Bilateral 10+ years ago     OB History    Gravida  2   Para  2   Term  1   Preterm  1   AB      Living        SAB      TAB      Ectopic      Multiple      Live Births               Home Medications    Prior to Admission medications   Medication Sig Start Date End Date Taking? Authorizing Provider  diclofenac (VOLTAREN) 75 MG EC tablet Take 1 tablet by mouth 2 (two) times daily as needed for mild pain.  09/12/15   [provider]  hydrochlorothiazide (HYDRODIURIL) 25 MG tablet TAKE 1 TABLET BY MOUTH EVERY DAY FOR BLOOD PRESSURE 02/09/13   Derrek Monaco A, NP  HYDROcodone-acetaminophen (NORCO/VICODIN) 5-325 MG tablet Take 1 tablet by mouth every 4 (four) hours as needed. 09/17/18   Evalee Jefferson, PA-C  HYDROcodone-acetaminophen (NORCO/VICODIN) 5-325 MG tablet Take 1 tablet by mouth every 4 (four) hours as needed. 09/17/18   Evalee Jefferson, PA-C  ibuprofen (ADVIL) 800 MG tablet Take 1 tablet (800 mg total) by mouth every 8 (eight) hours as needed. 07/30/18 10/28/18  Carole Civil, MD  lisinopril (PRINIVIL,ZESTRIL) 10 MG  tablet Take 10 mg by mouth daily.    [provider]  metFORMIN (GLUCOPHAGE) 500 MG tablet Take 500 mg 2 (two) times daily with a meal by mouth.    [provider]  methocarbamol (ROBAXIN) 750 MG tablet Take 1 tablet (750 mg total) by mouth 4 (four) times daily. Patient not taking: Reported on 11/26/2017 03/18/17   Carole Civil, MD  omeprazole (PRILOSEC) 20 MG capsule Take 20 mg by mouth daily. Reported on 08/01/2015    [provider]  sitaGLIPtin-metformin (JANUMET) 50-500 MG tablet Take 1 tablet by mouth 2 (two) times daily with a meal.    [provider]    Family History Family History  Problem Relation Age of Onset  . Hypertension Mother   . Diabetes Mother   . Diabetes Father   . Hypertension Father   . Cancer Sister 39       pancreatic    Social History Social History   Tobacco Use  . Smoking status: Never Smoker  . Smokeless tobacco: Never Used  Substance Use  Topics  . Alcohol use: Yes    Alcohol/week: 8.0 standard drinks    Types: 8 Glasses of wine per week    Comment: wine daily about a glass or 2  . Drug use: No     Allergies   Patient has no known allergies.   Review of Systems Review of Systems  Constitutional: Negative for fever.  Musculoskeletal: Positive for arthralgias, back pain and neck pain. Negative for joint swelling and myalgias.  Neurological: Negative for weakness and numbness.     Physical Exam Updated Vital Signs BP 124/67   Pulse 71   Temp 98.6 F (37 C) (Oral)   Resp 18   Ht 5\' 5"  (1.651 m)   Wt 112.9 kg   SpO2 98%   BMI 41.44 kg/m   Physical Exam Vitals signs and nursing note reviewed.  Constitutional:      Appearance: She is well-developed.  HENT:     Head: Normocephalic.  Eyes:     Conjunctiva/sclera: Conjunctivae normal.  Neck:     Musculoskeletal: Neck rigidity and muscular tenderness present.     Vascular: No carotid bruit.  Cardiovascular:     Rate and Rhythm: Normal rate.     Comments: Radial pulses normal. Pulmonary:     Effort: Pulmonary effort is normal.  Musculoskeletal:     Cervical back: She exhibits decreased range of motion, tenderness and spasm.     Lumbar back: She exhibits tenderness. She exhibits no swelling, no edema and no spasm.     Comments: ttp with muscle tightness noted right paracervical/trapezius distribution.  Pain worsened with rightward rotation, limited ROM of c spine.  No midline pain, deformity or edema.   Lymphadenopathy:     Cervical: No cervical adenopathy.  Skin:    General: Skin is warm and dry.  Neurological:     Mental Status: She is alert.     Sensory: No sensory deficit.     Motor: No tremor or atrophy.     Gait: Gait normal.     Deep Tendon Reflexes:     Reflex Scores:      Bicep reflexes are 2+ on the right side and 2+ on the left side.    Comments: No strength deficit noted in hip and knee flexor and extensor muscle groups.  Ankle flexion  and extension intact. Equal grip strength      ED Treatments / Results  Labs (all labs ordered are listed, but only abnormal results are displayed) Labs Reviewed - No data to display  EKG    Radiology No results found.  Procedures Procedures (including critical care time)  Medications Ordered in ED Medications  ketorolac (TORADOL) 30 MG/ML injection 30 mg (30 mg Intramuscular Given 09/17/18 2232)     Initial Impression / Assessment and Plan / ED Course  I have reviewed the triage vital signs and the nursing notes.  Pertinent labs & imaging results that were available during my care of the patient were reviewed by me and considered in my medical decision making (see chart for details).        Pt with trapezius muscle spasm/torticollis. Discussed imaging, but we both felt this would be low yield - she will defer to Dr. Ruthe Mannan rec's at this weeks visit.  No neuro deficit on exam, encouraged to continue muscle relaxer, she also has ibuprofen, encouraged to add in addition to heat tx followed by ROM which was demonstrated.  Driving home so gave hydrocodone prepack for her to take once home.  F/u ortho this week as planned.  Final Clinical Impressions(s) / ED Diagnoses   Final diagnoses:  Muscle spasms of neck    ED Discharge Orders         Ordered    HYDROcodone-acetaminophen (NORCO/VICODIN) 5-325 MG tablet  Every 4 hours PRN     09/17/18 2225    HYDROcodone-acetaminophen (NORCO/VICODIN) 5-325 MG tablet  Every 4 hours PRN     09/17/18 2226           Evalee Jefferson, PA-C 09/19/18 1328    Fredia Sorrow, MD 09/20/18 0745    Evalee Jefferson, PA-C 09/20/18 1326    Fredia Sorrow, MD 09/25/18 [phone]

## 2018-09-19 ENCOUNTER — Emergency Department (HOSPITAL_COMMUNITY): Payer: PRIVATE HEALTH INSURANCE

## 2018-09-19 ENCOUNTER — Emergency Department (HOSPITAL_COMMUNITY)
Admission: EM | Admit: 2018-09-19 | Discharge: 2018-09-19 | Disposition: A | Discharge status: Home / Self Care | Payer: PRIVATE HEALTH INSURANCE | Attending: Emergency Medicine | Admitting: Emergency Medicine

## 2018-09-19 ENCOUNTER — Encounter (HOSPITAL_COMMUNITY): Payer: Self-pay | Admitting: *Deleted

## 2018-09-19 ENCOUNTER — Other Ambulatory Visit: Payer: Self-pay

## 2018-09-19 DIAGNOSIS — M436 Torticollis: Secondary | ICD-10-CM | POA: Insufficient documentation

## 2018-09-19 DIAGNOSIS — Z7984 Long term (current) use of oral hypoglycemic drugs: Secondary | ICD-10-CM | POA: Insufficient documentation

## 2018-09-19 DIAGNOSIS — E119 Type 2 diabetes mellitus without complications: Secondary | ICD-10-CM | POA: Insufficient documentation

## 2018-09-19 DIAGNOSIS — I10 Essential (primary) hypertension: Secondary | ICD-10-CM | POA: Insufficient documentation

## 2018-09-19 DIAGNOSIS — R252 Cramp and spasm: Secondary | ICD-10-CM | POA: Diagnosis present

## 2018-09-19 DIAGNOSIS — Z79899 Other long term (current) drug therapy: Secondary | ICD-10-CM | POA: Diagnosis not present

## 2018-09-19 LAB — CBC WITH DIFFERENTIAL/PLATELET
Abs Immature Granulocytes: 0.03 10*3/uL (ref 0.00–0.07)
Basophils Absolute: 0 10*3/uL (ref 0.0–0.1)
Basophils Relative: 1 %
Eosinophils Absolute: 0.1 10*3/uL (ref 0.0–0.5)
Eosinophils Relative: 2 %
HCT: 34.4 % — ABNORMAL LOW (ref 36.0–46.0)
Hemoglobin: 10.4 g/dL — ABNORMAL LOW (ref 12.0–15.0)
Immature Granulocytes: 0 %
Lymphocytes Relative: 27 %
Lymphs Abs: 2.1 10*3/uL (ref 0.7–4.0)
MCH: 23.4 pg — ABNORMAL LOW (ref 26.0–34.0)
MCHC: 30.2 g/dL (ref 30.0–36.0)
MCV: 77.5 fL — ABNORMAL LOW (ref 80.0–100.0)
Monocytes Absolute: 0.4 10*3/uL (ref 0.1–1.0)
Monocytes Relative: 5 %
Neutro Abs: 5 10*3/uL (ref 1.7–7.7)
Neutrophils Relative %: 65 %
Platelets: 300 10*3/uL (ref 150–400)
RBC: 4.44 MIL/uL (ref 3.87–5.11)
RDW: 14.6 % (ref 11.5–15.5)
WBC: 7.6 10*3/uL (ref 4.0–10.5)
nRBC: 0 % (ref 0.0–0.2)

## 2018-09-19 LAB — BASIC METABOLIC PANEL
Anion gap: 11 (ref 5–15)
BUN: 19 mg/dL (ref 6–20)
CO2: 28 mmol/L (ref 22–32)
Calcium: 9.4 mg/dL (ref 8.9–10.3)
Chloride: 97 mmol/L — ABNORMAL LOW (ref 98–111)
Creatinine, Ser: 0.54 mg/dL (ref 0.44–1.00)
GFR calc Af Amer: >60 mL/min (ref >60)
GFR calc non Af Amer: >60 mL/min (ref >60)
Glucose, Bld: 105 mg/dL — ABNORMAL HIGH (ref 70–99)
Potassium: 3.6 mmol/L (ref 3.5–5.1)
Sodium: 136 mmol/L (ref 135–145)

## 2018-09-19 MED ORDER — MORPHINE SULFATE (PF) 4 MG/ML IV SOLN: 4.0000 mg | Freq: Once | INTRAVENOUS | Status: DC

## 2018-09-19 MED ORDER — MORPHINE SULFATE (PF) 4 MG/ML IV SOLN
4.0000 mg | Freq: Once | INTRAVENOUS | Status: AC
  Administered 2018-09-19: 4 mg via INTRAVENOUS
  Filled 2018-09-19: qty 1

## 2018-09-19 MED ORDER — DIAZEPAM 5 MG PO TABS
5.0000 mg | ORAL_TABLET | Freq: Once | ORAL | Status: AC
  Administered 2018-09-19: 5 mg via ORAL
  Filled 2018-09-19: qty 1

## 2018-09-19 MED ORDER — ONDANSETRON 4 MG PO TBDP: 4.0000 mg | ORAL_TABLET | Freq: Once | ORAL | Status: DC

## 2018-09-19 MED ORDER — DIAZEPAM 5 MG PO TABS: 5.0000 mg | ORAL_TABLET | Freq: Three times a day (TID) | ORAL | 0 refills | Status: DC | PRN

## 2018-09-19 MED ORDER — ONDANSETRON HCL 4 MG/2ML IJ SOLN
4.0000 mg | Freq: Once | INTRAMUSCULAR | Status: AC
  Administered 2018-09-19: 4 mg via INTRAVENOUS
  Filled 2018-09-19: qty 2

## 2018-09-19 MED ORDER — IOHEXOL 300 MG/ML  SOLN
75.0000 mL | Freq: Once | INTRAMUSCULAR | Status: AC | PRN
  Administered 2018-09-19: 22:00:00 75 mL via INTRAVENOUS

## 2018-09-19 MED FILL — Hydrocodone-Acetaminophen Tab 5-325 MG: ORAL | Qty: 6 | Status: AC

## 2018-09-19 NOTE — Discharge Instructions (Addendum)
As discussed, your CT imaging shows that you have mild degenerative changes in your cervical spine from the C4-C6 level, which is probably not the source of your symptoms, rather I suspect that this is muscle spasm.  Continue to take your Percocet that you were prescribed here, you may increase to 1-1/2 tablets if needed for better pain relief.  Start taking Valium in place of your Robaxin-this is a stronger muscle relaxer.  However use caution with these medications as they will both make you drowsy.  Do not drive within 4 hours of taking either medicine.  Continue applying heat as previously discussed.

## 2018-09-19 NOTE — ED Provider Notes (Signed)
[facility] EMERGENCY DEPARTMENT Provider Note   CSN: [id number] Arrival date & time: 09/19/18  1817     History   Chief Complaint Chief Complaint  Patient presents with  . Spasms    HPI Susan Quinn is a 58 y.o. female with a history of HTN, osteoarthritis and sciatica, seen by me here 2 nights ago and treated for right torticollis returning for worsened pain and spasm despite taking anti inflammatories, robaxin and oxycodone along with heat tx recommended here.  Today she was simply sitting in a chair when she had sudden onset of pain and spasm right neck region with radiation into her posterior right scalp and wrapped more anteriorly along her neck.  She denies CP, sob, fevers, n/v.  She feels pain in the right ear, denies tinnitus or decreased hearing acuity.  She is scheduled for f/u care with Dr. Aline Brochure in 2 days.     The history is provided by the patient.    Past Medical History:  Diagnosis Date  . Anemia   . Bowel obstruction ([place])   . Fibroids    history of  . Fibroids   . Hypertension   . Infection of mouth 2014  . Obesity 06/30/2012  . Reflux 06/30/2012  . Sciatica   . Sleep apnea   . Sleep apnea     Patient Active Problem List   Diagnosis Date Noted  . Diabetes ([place]) 08/01/2015  . Hypertension 06/30/2012  . Reflux 06/30/2012  . Abscess of upper gum 06/30/2012  . Obesity 06/30/2012    Past Surgical History:  Procedure Laterality Date  .  bowel obstruction    . ABDOMINAL HYSTERECTOMY    . BREAST SURGERY    . CESAREAN SECTION    . COLONOSCOPY N/A 10/18/2015   Procedure: COLONOSCOPY;  Surgeon: Rogene Houston, MD;  Location: [facility];  Service: Endoscopy;  Laterality: N/A;  915  . excision of scar    . REDUCTION MAMMAPLASTY Bilateral 10+ years ago     OB History    Gravida  2   Para  2   Term  1   Preterm  1   AB      Living        SAB      TAB      Ectopic      Multiple      Live Births               Home  Medications    Prior to Admission medications   Medication Sig Start Date End Date Taking? Authorizing Provider  acetaminophen (TYLENOL) 500 MG tablet Take 500 mg by mouth every 6 (six) hours as needed for mild pain or moderate pain.   Yes [provider]  ALPRAZolam (XANAX) 0.25 MG tablet Take 0.25 mg by mouth daily as needed for anxiety. For anxiety 09/08/18  Yes [provider]  diclofenac (VOLTAREN) 75 MG EC tablet Take 1 tablet by mouth 2 (two) times daily as needed for mild pain.  09/12/15  Yes [provider]  hydrochlorothiazide (HYDRODIURIL) 25 MG tablet TAKE 1 TABLET BY MOUTH EVERY DAY FOR BLOOD PRESSURE Patient taking differently: Take 25 mg by mouth daily.  02/09/13  Yes Derrek Monaco A, NP  HYDROcodone-acetaminophen (NORCO/VICODIN) 5-325 MG tablet Take 1 tablet by mouth every 4 (four) hours as needed. Patient taking differently: Take 1 tablet by mouth every 4 (four) hours as needed for moderate pain.  09/17/18  Yes Taelyr Jantz,  Almyra Free, PA-C  ibuprofen (ADVIL) 800 MG tablet Take 1 tablet (800 mg total) by mouth every 8 (eight) hours as needed. 07/30/18 10/28/18 Yes Carole Civil, MD  Iron-FA-B Cmp-C-Biot-Probiotic (FUSION PLUS) CAPS Take 1 capsule by mouth daily. 09/09/18  Yes [provider]  lisinopril (PRINIVIL,ZESTRIL) 10 MG tablet Take 10 mg by mouth daily.   Yes [provider]  LUMIFY 0.025 % SOLN Place 1 drop into both eyes every 8 (eight) hours. 09/09/18  Yes [provider]  metFORMIN (GLUCOPHAGE) 500 MG tablet Take 1,000 mg by mouth at bedtime.    Yes [provider]  methocarbamol (ROBAXIN) 750 MG tablet Take 1 tablet (750 mg total) by mouth 4 (four) times daily. Patient taking differently: Take 750 mg by mouth once as needed for muscle spasms.  03/18/17  Yes Carole Civil, MD  omeprazole (PRILOSEC OTC) 20 MG tablet Take 20 mg by mouth every morning.   Yes [provider]  HYDROcodone-acetaminophen  (NORCO/VICODIN) 5-325 MG tablet Take 1 tablet by mouth every 4 (four) hours as needed. Patient not taking: Reported on 09/19/2018 09/17/18   Evalee Jefferson, PA-C    Family History Family History  Problem Relation Age of Onset  . Hypertension Mother   . Diabetes Mother   . Diabetes Father   . Hypertension Father   . Cancer Sister 30       pancreatic    Social History Social History   Tobacco Use  . Smoking status: Never Smoker  . Smokeless tobacco: Never Used  Substance Use Topics  . Alcohol use: Yes    Alcohol/week: 8.0 standard drinks    Types: 8 Glasses of wine per week    Comment: wine daily about a glass or 2  . Drug use: No     Allergies   Patient has no known allergies.   Review of Systems Review of Systems  Constitutional: Negative for chills and fever.  HENT: Positive for ear pain. Negative for congestion, ear discharge, facial swelling, sinus pressure, sinus pain, sore throat and trouble swallowing.   Eyes: Negative.  Negative for visual disturbance.  Respiratory: Negative for chest tightness and shortness of breath.   Cardiovascular: Negative for chest pain.  Gastrointestinal: Negative for abdominal pain, nausea and vomiting.  Genitourinary: Negative.   Musculoskeletal: Positive for neck pain and neck stiffness. Negative for arthralgias and joint swelling.  Skin: Negative.  Negative for rash and wound.  Neurological: Negative for dizziness, weakness, light-headedness, numbness and headaches.  Psychiatric/Behavioral: Negative.      Physical Exam Updated Vital Signs BP 137/74 (BP Location: Right Arm)   Pulse 86   Temp 98.3 F (36.8 C) (Oral)   Resp 18   Ht 5\' 5"  (1.651 m)   Wt 112.9 kg   SpO2 100%   BMI 41.44 kg/m   Physical Exam Vitals signs and nursing note reviewed.  Constitutional:      Appearance: She is well-developed.  HENT:     Head: Normocephalic.  Eyes:     Conjunctiva/sclera: Conjunctivae normal.  Neck:     Musculoskeletal: Neck  rigidity and muscular tenderness present.     Vascular: No carotid bruit.  Cardiovascular:     Rate and Rhythm: Normal rate.     Pulses: Normal pulses.     Comments: Radial pulses equal. Pulmonary:     Effort: Pulmonary effort is normal.  Musculoskeletal:     Cervical back: She exhibits decreased range of motion, tenderness and spasm.  Lumbar back: She exhibits tenderness. She exhibits no swelling, no edema and no spasm.     Comments: ttp with muscle tightness noted right paracervical/trapezius distribution.  Pain worsened with rightward rotation, limited ROM of c spine.  No midline pain, deformity or edema.   Lymphadenopathy:     Cervical: No cervical adenopathy.  Skin:    General: Skin is warm and dry.  Neurological:     General: No focal deficit present.     Mental Status: She is alert.     Cranial Nerves: No cranial nerve deficit.     Sensory: No sensory deficit.     Motor: No tremor or atrophy.     Gait: Gait normal.     Deep Tendon Reflexes:     Reflex Scores:      Bicep reflexes are 2+ on the right side and 2+ on the left side.    Comments: No strength deficit noted in hip and knee flexor and extensor muscle groups.  Ankle flexion and extension intact. Equal grip strength      ED Treatments / Results  Labs (all labs ordered are listed, but only abnormal results are displayed) Labs Reviewed  CBC WITH DIFFERENTIAL/PLATELET - Abnormal; Notable for the following components:      Result Value   Hemoglobin 10.4 (*)    HCT 34.4 (*)    MCV 77.5 (*)    MCH 23.4 (*)    All other components within normal limits  BASIC METABOLIC PANEL - Abnormal; Notable for the following components:   Chloride 97 (*)    Glucose, Bld 105 (*)    All other components within normal limits    EKG    Radiology Ct Soft Tissue Neck W Contrast  Result Date: 09/19/2018 CLINICAL DATA:  Initial evaluation for muscle spasms to right side of neck. EXAM: CT NECK WITH CONTRAST TECHNIQUE:  Multidetector CT imaging of the neck was performed using the standard protocol following the bolus administration of intravenous contrast. CONTRAST:  22mL OMNIPAQUE IOHEXOL 300 MG/ML  SOLN COMPARISON:  None. FINDINGS: Pharynx and larynx: Oral cavity within normal limits without discrete mass or loculated collection. Palatine tonsils symmetric and within normal limits. Parapharyngeal fat maintained. Nasopharynx within normal limits. No retropharyngeal collection. Epiglottis normal. Vallecula largely effaced. Remainder of the hypopharynx and supraglottic larynx within normal limits. Glottis apposed and not well assessed. Subglottic airway clear. Salivary glands: Salivary glands including the parotid and submandibular glands within normal limits. Thyroid: Thyroid normal. Lymph nodes: No pathologically enlarged lymph nodes identified within the neck. Vascular: Mild atherosclerotic change about the right carotid bifurcation. Normal intravascular enhancement seen throughout the neck. Limited intracranial: Unremarkable. Visualized orbits: Globes and orbital soft tissues within normal limits. Mastoids and visualized paranasal sinuses: Mild mucosal thickening within the maxillary sinuses bilaterally. Paranasal sinuses are otherwise clear. Visualize mastoids and middle ear cavities are well pneumatized and free of fluid. Skeleton: No acute osseous finding. No discrete lytic or blastic osseous lesions. Mild to moderate cervical spondylolysis noted at C3-4 through C5-6. Upper chest: Visualized upper chest demonstrates no acute finding. Other: None. IMPRESSION: Negative CT of the neck. No acute abnormality identified. Electronically Signed   By: Jeannine Boga M.D.   On: 09/19/2018 22:25   Ct C-spine No Charge  Result Date: 09/19/2018 CLINICAL DATA:  Initial evaluation for continued muscle spasms to posterior right neck, torticollis. EXAM: CT CERVICAL SPINE WITHOUT CONTRAST TECHNIQUE: Multidetector CT imaging of the  cervical spine was performed without intravenous contrast. Multiplanar CT  image reconstructions were also generated. COMPARISON:  None. FINDINGS: Alignment: Straightening of the normal cervical lordosis. No listhesis. Skull base and vertebrae: Skull base intact. Normal C1-2 articulations are preserved in the dens is intact. Vertebral body heights maintained. No acute fracture or other osseous abnormality. No discrete lytic or blastic osseous lesions. Soft tissues and spinal canal: Visualized paraspinous soft tissues demonstrate no acute finding. No abnormal prevertebral edema. Mild atherosclerotic change about the right carotid bifurcation. Disc levels: Moderate cervical spondylolysis present at C3-4 through C5-6, most pronounced at the C5-6 level. Mild spinal stenosis at C3-4 and C5-6. Upper chest: Visualized upper chest demonstrates no acute finding. Partially visualized lungs are clear. Other: None. IMPRESSION: 1. No CT evidence for acute osseous abnormality within the cervical spine. 2. Straightening of the normal cervical lordosis, which could be related to positioning and/or muscular spasm. 3. 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 Electronically Signed   By: Jeannine Boga M.D.   On: 09/19/2018 22:48    Procedures Procedures (including critical care time)  Medications Ordered in ED Medications  morphine 4 MG/ML injection 4 mg (4 mg Intravenous Given 09/19/18 2127)  ondansetron (ZOFRAN) injection 4 mg (4 mg Intravenous Given 09/19/18 2127)  iohexol (OMNIPAQUE) 300 MG/ML solution 75 mL (75 mLs Intravenous Contrast Given 09/19/18 2202)     Initial Impression / Assessment and Plan / ED Course  I have reviewed the triage vital signs and the nursing notes.  Pertinent labs & imaging results that were available during my care of the patient were reviewed by me and considered in my medical decision making (see chart for  details).        Pt underwent  CT imaging to assess possible skeletal or soft tissue source of pain sx given 3rd visit with escalating sx (was seen in the AM by pcp prior to visit here 2 days ago).  No concerning findings except loss of lordosis helping to confirm persistent torticollis. Pt switched to valium, asked to dc while on this medicine, may consider increasing her oxycodone prescribed here 2 days ago to 1.5 tabs if needed for increased sx relief. Cautioned re additive sedation effects.  Plan f/u with ortho as she is planning in 2 days.  Pt was seen by Dr. Roderic Palau during ed visit.  Final Clinical Impressions(s) / ED Diagnoses   Final diagnoses:  Torticollis    ED Discharge Orders    None       Landis Martins 09/20/18 1323    Milton Ferguson, MD 09/20/18 [phone]

## 2018-09-19 NOTE — ED Triage Notes (Signed)
Pt with continued muscle spasms to back of right neck. Seen for same Saturday night, has an ortho appt for this coming Wednesday.

## 2018-09-19 NOTE — ED Notes (Signed)
Patient transported to CT 

## 2018-09-21 ENCOUNTER — Telehealth: Payer: Self-pay | Admitting: Orthopedic Surgery

## 2018-09-21 ENCOUNTER — Ambulatory Visit: Payer: PRIVATE HEALTH INSURANCE | Admitting: Orthopedic Surgery

## 2018-09-21 NOTE — Telephone Encounter (Signed)
Susan Quinn called and canceled her appointment for today.  She has had several ER visits and urgent care.  Also saw pain specialist.  She will call back to reschedule the appointment.

## 2018-10-24 ENCOUNTER — Telehealth: Payer: Self-pay | Admitting: Orthopedic Surgery

## 2018-10-24 NOTE — Telephone Encounter (Signed)
Patient called to request a boot, "same as the last one prescribed in 2019 for right ankle, for problem: posterior tibial tendon dysfunction. States she's rolled the same ankle again, and is unable to find the boot. Please advise if a prescription can be issued or if a visit is to be scheduled. Patient's [phone]

## 2018-10-25 NOTE — Telephone Encounter (Signed)
Since she had a re injury, would be best for her to come in.

## 2018-10-25 NOTE — Telephone Encounter (Signed)
Called back to patient, automated message indicates not available at this time and to call back.

## 2018-10-26 NOTE — Telephone Encounter (Signed)
Patient returned call; appointment scheduled; aware.

## 2018-10-26 NOTE — Telephone Encounter (Signed)
Called back again to patient to relay per response received; left voice mail message to return call.

## 2018-10-28 ENCOUNTER — Other Ambulatory Visit: Payer: Self-pay

## 2018-10-28 ENCOUNTER — Ambulatory Visit (INDEPENDENT_AMBULATORY_CARE_PROVIDER_SITE_OTHER): Payer: PRIVATE HEALTH INSURANCE | Admitting: Orthopedic Surgery

## 2018-10-28 ENCOUNTER — Encounter: Payer: Self-pay | Admitting: Orthopedic Surgery

## 2018-10-28 VITALS — BP 143/74 | HR 84 | Temp 98.2°F | Ht 65.0 in | Wt 249.0 lb

## 2018-10-28 DIAGNOSIS — M76829 Posterior tibial tendinitis, unspecified leg: Secondary | ICD-10-CM

## 2018-10-28 NOTE — Progress Notes (Signed)
Chief Complaint  Patient presents with  . Foot Pain    Right foot pain.    58 year old history of posterior tibial tendon dysfunction right foot presents with 1 to 2-week history of painful right foot with posterior medial ankle pain foot pain in the plantar aspect with swelling tenderness decreased range of motion trouble walking  ROS Recent Trouble with occipital nerve irritation and sciatica.  Went to see Dr. but they gave her an injection for the occipital nerve which helped and her sciatica magically went away with rest  BP (!) 143/74   Pulse 84   Ht 5\' 5"  (1.651 m)   Wt 249 lb (112.9 kg)   BMI 41.44 kg/m  Altered gait pattern limping avoiding the medial aspect of the foot walking in the lateral aspect of the foot Awake alert and oriented x3 Right foot Flatfoot deformity tenderness posterior medial tibial plantar aspect of foot subtalar joint is supple ankle motion is normal ankle is stable Neurovascular exam intact   Encounter Diagnosis  Name Primary?  Marland Kitchen PTTD (posterior tibial tendon dysfunction) Yes     Short cam walker Ice Rest Supportive gait as needed Follow-up 6 weeks

## 2018-10-28 NOTE — Patient Instructions (Addendum)
Ice, cam walker, support with crutches as needed

## 2019-02-28 ENCOUNTER — Other Ambulatory Visit: Payer: Self-pay | Admitting: Internal Medicine

## 2019-02-28 DIAGNOSIS — Z1231 Encounter for screening mammogram for malignant neoplasm of breast: Secondary | ICD-10-CM

## 2019-03-28 ENCOUNTER — Other Ambulatory Visit: Payer: Self-pay

## 2019-03-28 ENCOUNTER — Ambulatory Visit: Payer: PRIVATE HEALTH INSURANCE | Attending: Internal Medicine

## 2019-03-28 DIAGNOSIS — Z20822 Contact with and (suspected) exposure to covid-19: Secondary | ICD-10-CM

## 2019-03-29 LAB — NOVEL CORONAVIRUS, NAA: SARS-CoV-2, NAA: NOT DETECTED

## 2019-04-19 ENCOUNTER — Ambulatory Visit
Admission: RE | Admit: 2019-04-19 | Discharge: 2019-04-19 | Disposition: A | Discharge status: Home / Self Care | Payer: PRIVATE HEALTH INSURANCE | Source: Ambulatory Visit | Attending: Internal Medicine | Admitting: Internal Medicine

## 2019-04-19 ENCOUNTER — Other Ambulatory Visit: Payer: Self-pay

## 2019-04-19 DIAGNOSIS — Z1231 Encounter for screening mammogram for malignant neoplasm of breast: Secondary | ICD-10-CM

## 2020-02-21 ENCOUNTER — Ambulatory Visit: Payer: PRIVATE HEALTH INSURANCE

## 2020-02-21 ENCOUNTER — Encounter: Payer: Self-pay | Admitting: Orthopedic Surgery

## 2020-02-21 ENCOUNTER — Ambulatory Visit (INDEPENDENT_AMBULATORY_CARE_PROVIDER_SITE_OTHER): Payer: PRIVATE HEALTH INSURANCE | Admitting: Orthopedic Surgery

## 2020-02-21 ENCOUNTER — Other Ambulatory Visit: Payer: Self-pay

## 2020-02-21 VITALS — BP 153/83 | HR 70 | Ht 65.0 in | Wt 246.0 lb

## 2020-02-21 DIAGNOSIS — M79671 Pain in right foot: Secondary | ICD-10-CM

## 2020-02-21 DIAGNOSIS — M76821 Posterior tibial tendinitis, right leg: Secondary | ICD-10-CM

## 2020-02-21 DIAGNOSIS — Q742 Other congenital malformations of lower limb(s), including pelvic girdle: Secondary | ICD-10-CM

## 2020-02-21 NOTE — Progress Notes (Signed)
New Patient Visit  Assessment: Susan Quinn is a 59 y.o. female with the following: 1.  Chronic posterior tibial tendon dysfunction, right foot 2.  Pain associated with fused accessory navicular, right foot  Plan: Susan Quinn continues to have episodes of severe pain in the medial aspect of her right foot.  Dr. Aline Brochure has treated her for this in the past, which included immobilization, nonweightbearing and medications as needed.  She presented once again with an acute flare, and is having significant discomfort.  I talked to her about her pain medications, and she is taking narcotics, as well as anti-inflammatory medications.  I do not have an injection to offer her today.  I reviewed the radiographs and it looks like she may have a fused accessory navicular bone, which could be contributing to her chronic symptoms.  Unfortunately, I do not have anything additional to offer her at this point.  She expressed interest in referral to a foot specialist for further treatment.  We will place referral to Dr. Sharol Given and they will contact her to schedule an appointment.  Return to clinic as needed.   Follow-up: Return if symptoms worsen or fail to improve.  Subjective:  Chief Complaint  Patient presents with  . Foot Pain    patient reports started on 02/16/20 and foot will not go flat    History of Present Illness: Susan Quinn is a 59 y.o. female who presents to clinic for evaluation of her right foot.  She is had this issue several times in the past, and has been treated by Dr. Aline Brochure.  Last week, she started to have pain in the medial aspect of the right foot.  As she has done previously, she started to use a walking boot, and crutches to assist with ambulation.  In time, this is alleviated her symptoms.  However, the pain is more severe than it has been in the past.  She presented to the emergency department for evaluation, and was subsequently referred to our clinic today.   She walks in the lateral border of her foot, as it is difficult to get her foot flat on the floor.  She has been taking anti-inflammatory medications, as well as narcotic pain medications with little relief.  He has not had any procedures on this foot in the past.  No similar issues in her left foot.   Review of Systems: No fevers or chills No numbness or tingling No chest pain No shortness of breath No bowel or bladder dysfunction No GI distress No headaches   Medical History:  Past Medical History:  Diagnosis Date  . Anemia   . Bowel obstruction ([place])   . Fibroids    history of  . Fibroids   . Hypertension   . Infection of mouth 2014  . Obesity 06/30/2012  . Reflux 06/30/2012  . Sciatica   . Sleep apnea   . Sleep apnea     Past Surgical History:  Procedure Laterality Date  .  bowel obstruction    . ABDOMINAL HYSTERECTOMY    . BREAST SURGERY    . CESAREAN SECTION    . COLONOSCOPY N/A 10/18/2015   Procedure: COLONOSCOPY;  Surgeon: Rogene Houston, MD;  Location: [facility];  Service: Endoscopy;  Laterality: N/A;  915  . excision of scar    . REDUCTION MAMMAPLASTY Bilateral 10+ years ago    Family History  Problem Relation Age of Onset  . Hypertension Mother   . Diabetes Mother   .  Diabetes Father   . Hypertension Father   . Cancer Sister 59       pancreatic   Social History   Tobacco Use  . Smoking status: Never Smoker  . Smokeless tobacco: Never Used  Substance Use Topics  . Alcohol use: Yes    Alcohol/week: 8.0 standard drinks    Types: 8 Glasses of wine per week    Comment: wine daily about a glass or 2  . Drug use: No    No Known Allergies  Current Meds  Medication Sig  . ALPRAZolam (XANAX) 0.25 MG tablet Take 0.25 mg by mouth daily as needed for anxiety. For anxiety  . baclofen (LIORESAL) 10 MG tablet TK 1 T PO TID PRF SEVERE PAIN  . Chlorzoxazone 750 MG TABS TK 1 T PO QID  . diazepam (VALIUM) 5 MG tablet Take 1 tablet (5 mg total) by mouth  every 8 (eight) hours as needed for muscle spasms.  . diclofenac (VOLTAREN) 75 MG EC tablet Take 1 tablet by mouth 2 (two) times daily as needed for mild pain.   . hydrochlorothiazide (HYDRODIURIL) 25 MG tablet TAKE 1 TABLET BY MOUTH EVERY DAY FOR BLOOD PRESSURE (Patient taking differently: Take 25 mg by mouth daily. )  . indomethacin (INDOCIN) 50 MG capsule TK 1 C PO TID  . Iron-FA-B Cmp-C-Biot-Probiotic (FUSION PLUS) CAPS Take 1 capsule by mouth daily.  Marland Kitchen ketorolac (TORADOL) 10 MG tablet TK 1 T PO QID  . lisinopril (PRINIVIL,ZESTRIL) 10 MG tablet Take 10 mg by mouth daily.  Marland Kitchen LUMIFY 0.025 % SOLN Place 1 drop into both eyes every 8 (eight) hours.  . metaxalone (SKELAXIN) 800 MG tablet TK 1 T PO QID  . metFORMIN (GLUCOPHAGE) 500 MG tablet Take 1,000 mg by mouth at bedtime.   Marland Kitchen omeprazole (PRILOSEC OTC) 20 MG tablet Take 20 mg by mouth every morning.    Objective: BP (!) 153/83   Pulse 70   Ht 5\' 5"  (1.651 m)   Wt 246 lb (111.6 kg)   BMI 40.94 kg/m   Physical Exam:  General: Alert and oriented.  In some distress due to the pain. Gait: Unable to ambulate without the assistance of crutches.  Evaluation of the right foot demonstrates no obvious deformity.  She does appear to have some mild swelling in the medial aspect of her foot.  She is unable to get her foot flat on the floor.  She is exquisitely tender over the medial midfoot in the area of the arch, extending proximally along the course of the posterior tibial tendon.  Sensation is intact distally.  Toes are warm and well perfused.  Evaluation of the left foot demonstrates no deformity.  No tenderness along the medial arch.  No tenderness along the course of the posterior tibial tendon.  Easily gets to a plantigrade position.  Toes are warm and well perfused.    IMAGING: I personally ordered and reviewed the following images   X-rays of the right foot demonstrate no obvious acute abnormality.  There are no fractures or  dislocations appreciated.  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     Mordecai Rasmussen, MD  02/21/2020 11:55 AM

## 2020-03-01 ENCOUNTER — Other Ambulatory Visit: Payer: Self-pay

## 2020-03-01 ENCOUNTER — Encounter: Payer: Self-pay | Admitting: Orthopaedic Surgery

## 2020-03-01 ENCOUNTER — Ambulatory Visit (INDEPENDENT_AMBULATORY_CARE_PROVIDER_SITE_OTHER): Payer: PRIVATE HEALTH INSURANCE | Admitting: Orthopaedic Surgery

## 2020-03-01 VITALS — Ht 65.0 in | Wt 244.0 lb

## 2020-03-01 DIAGNOSIS — M76829 Posterior tibial tendinitis, unspecified leg: Secondary | ICD-10-CM

## 2020-03-01 DIAGNOSIS — M79671 Pain in right foot: Secondary | ICD-10-CM

## 2020-03-01 MED ORDER — DIAZEPAM 5 MG PO TABS: ORAL_TABLET | ORAL | 0 refills | Status: DC

## 2020-03-04 DIAGNOSIS — M76829 Posterior tibial tendinitis, unspecified leg: Secondary | ICD-10-CM | POA: Insufficient documentation

## 2020-03-04 NOTE — Progress Notes (Signed)
Office Visit Note   Patient: Susan Quinn           Date of Birth: Jun 15, 1960           MRN: [id number] Visit Date: 03/01/2020              Requested by: Mordecai Rasmussen, MD [phone],  [place] [address] PCP: Celene Squibb, MD   Assessment & Plan: Visit Diagnoses:  1. Pain in right foot   2. Posterior tibial tendon dysfunction     Plan: We discussed posterior tibial tendon dysfunction.  MRI scan right foot recommended she needs to keep her cam boot on to rest the tendon.  We discussed options if she has partial tearing versus mild tendinopathy.  She has had greater than 6 weeks of conservative treatment anti-inflammatories Tylenol without relief.  Follow-Up Instructions: No follow-ups on file.   Orders:  Orders Placed This Encounter  Procedures  . MR Foot Right w/o contrast   Meds ordered this encounter  Medications  . diazepam (VALIUM) 5 MG tablet    Sig: Take one tablet one hour prior to procedure. May repeat as needed. Must have driver.    Dispense:  3 tablet    Refill:  0      Procedures: No procedures performed   Clinical Data: No additional findings.   Subjective: Chief Complaint  Patient presents with  . Right Foot - Pain    HPI 59 year old female seen with right foot pain.  She previously saw Dr. Kenton Kingfisher and was diagnosed with posterior tibial tendon dysfunction.  Patient states she has had problems with her foot with pronation on the right foot.  She has some on the left but not as severe.  She saw a "sports medicine family practice doctor" the week of Thanksgiving was told nothing to be done to help her besides staying with a cam boot.  She worked for 3 weeks got better stop using it second time she only wore it 2 weeks and now she is back in the short cam boot to the right foot.  Patient is used Tylenol without relief.  Patient's had problems with occipital nerve irritation sciatic in the past not related to her current foot  problem.  Review of Systems   All other systems are neg.   Objective: Vital Signs: Ht 5\' 5"  (1.651 m)   Wt 244 lb (110.7 kg)   BMI 40.60 kg/m   Physical Exam Constitutional:      Appearance: She is well-developed.  HENT:     Head: Normocephalic.     Right Ear: External ear normal.     Left Ear: External ear normal.  Eyes:     Pupils: Pupils are equal, round, and reactive to light.  Neck:     Thyroid: No thyromegaly.     Trachea: No tracheal deviation.  Cardiovascular:     Rate and Rhythm: Normal rate.  Pulmonary:     Effort: Pulmonary effort is normal.  Abdominal:     Palpations: Abdomen is soft.  Skin:    General: Skin is warm and dry.  Neurological:     Mental Status: She is alert and oriented to person, place, and time.  Psychiatric:        Behavior: Behavior normal.     Ortho Exam patient has tenderness of the posterior tibial tendon distal insertion site and for 2 cm prior to insertion.  Pain with resisted posterior tibial testing bilateral pronation  gait.  She has increased pain with toe walking.  Specialty Comments:  No specialty comments available.  Imaging: No results found.   PMFS History: Patient Active Problem List   Diagnosis Date Noted  . Posterior tibial tendon dysfunction 03/04/2020  . Diabetes ([place]) 08/01/2015  . Hypertension 06/30/2012  . Reflux 06/30/2012  . Abscess of upper gum 06/30/2012  . Obesity 06/30/2012   Past Medical History:  Diagnosis Date  . Anemia   . Bowel obstruction ([place])   . Fibroids    history of  . Fibroids   . Hypertension   . Infection of mouth 2014  . Obesity 06/30/2012  . Reflux 06/30/2012  . Sciatica   . Sleep apnea   . Sleep apnea     Family History  Problem Relation Age of Onset  . Hypertension Mother   . Diabetes Mother   . Diabetes Father   . Hypertension Father   . Cancer Sister 75       pancreatic    Past Surgical History:  Procedure Laterality Date  .  bowel obstruction    . ABDOMINAL  HYSTERECTOMY    . BREAST SURGERY    . CESAREAN SECTION    . COLONOSCOPY N/A 10/18/2015   Procedure: COLONOSCOPY;  Surgeon: Rogene Houston, MD;  Location[facility];  Service: Endoscopy;  Laterality: N/A;  915  . excision of scar    . REDUCTION MAMMAPLASTY Bilateral 10+ years ago   Social History   Occupational History  . Not on file  Tobacco Use  . Smoking status: Never Smoker  . Smokeless tobacco: Never Used  Substance and Sexual Activity  . Alcohol use: Yes    Alcohol/week: 8.0 standard drinks    Types: 8 Glasses of wine per week    Comment: wine daily about a glass or 2  . Drug use: No  . Sexual activity: Yes    Birth control/protection: Surgical

## 2020-03-07 ENCOUNTER — Ambulatory Visit: Payer: PRIVATE HEALTH INSURANCE | Admitting: Orthopedic Surgery

## 2020-03-18 ENCOUNTER — Ambulatory Visit: Payer: PRIVATE HEALTH INSURANCE | Admitting: Orthopedic Surgery

## 2020-10-28 ENCOUNTER — Other Ambulatory Visit: Payer: Self-pay | Admitting: Internal Medicine

## 2020-10-28 DIAGNOSIS — Z1231 Encounter for screening mammogram for malignant neoplasm of breast: Secondary | ICD-10-CM

## 2020-12-19 ENCOUNTER — Other Ambulatory Visit: Payer: Self-pay

## 2020-12-19 ENCOUNTER — Ambulatory Visit
Admission: RE | Admit: 2020-12-19 | Discharge: 2020-12-19 | Disposition: A | Discharge status: Home / Self Care | Payer: No Typology Code available for payment source | Source: Ambulatory Visit | Attending: Internal Medicine | Admitting: Internal Medicine

## 2020-12-19 DIAGNOSIS — Z1231 Encounter for screening mammogram for malignant neoplasm of breast: Secondary | ICD-10-CM

## 2021-04-17 ENCOUNTER — Other Ambulatory Visit (HOSPITAL_COMMUNITY): Payer: Self-pay | Admitting: Family Medicine

## 2021-04-17 ENCOUNTER — Other Ambulatory Visit: Payer: Self-pay | Admitting: Family Medicine

## 2021-04-17 DIAGNOSIS — R112 Nausea with vomiting, unspecified: Secondary | ICD-10-CM

## 2021-04-24 ENCOUNTER — Ambulatory Visit (HOSPITAL_COMMUNITY): Payer: PRIVATE HEALTH INSURANCE

## 2021-04-28 ENCOUNTER — Ambulatory Visit (HOSPITAL_COMMUNITY): Payer: PRIVATE HEALTH INSURANCE

## 2021-04-28 ENCOUNTER — Encounter (HOSPITAL_COMMUNITY): Payer: Self-pay

## 2021-04-30 ENCOUNTER — Other Ambulatory Visit (HOSPITAL_COMMUNITY): Payer: Self-pay | Admitting: Family Medicine

## 2021-04-30 DIAGNOSIS — R112 Nausea with vomiting, unspecified: Secondary | ICD-10-CM

## 2021-05-06 ENCOUNTER — Ambulatory Visit (HOSPITAL_COMMUNITY)
Admission: RE | Admit: 2021-05-06 | Discharge: 2021-05-06 | Disposition: A | Discharge status: Home / Self Care | Payer: PRIVATE HEALTH INSURANCE | Source: Ambulatory Visit | Attending: Family Medicine | Admitting: Family Medicine

## 2021-05-06 ENCOUNTER — Other Ambulatory Visit: Payer: Self-pay

## 2021-05-06 DIAGNOSIS — R112 Nausea with vomiting, unspecified: Secondary | ICD-10-CM | POA: Diagnosis not present

## 2021-05-08 ENCOUNTER — Other Ambulatory Visit (HOSPITAL_COMMUNITY): Payer: Self-pay | Admitting: Family Medicine

## 2021-05-08 DIAGNOSIS — R112 Nausea with vomiting, unspecified: Secondary | ICD-10-CM

## 2021-07-10 ENCOUNTER — Ambulatory Visit (HOSPITAL_COMMUNITY): Admission: RE | Admit: 2021-07-10 | Payer: PRIVATE HEALTH INSURANCE | Source: Ambulatory Visit

## 2022-03-31 ENCOUNTER — Other Ambulatory Visit (HOSPITAL_COMMUNITY): Payer: Self-pay | Admitting: Nurse Practitioner

## 2022-03-31 DIAGNOSIS — R0789 Other chest pain: Secondary | ICD-10-CM

## 2022-04-13 ENCOUNTER — Ambulatory Visit (HOSPITAL_COMMUNITY): Admission: RE | Admit: 2022-04-13 | Payer: PRIVATE HEALTH INSURANCE | Source: Ambulatory Visit

## 2022-04-27 ENCOUNTER — Ambulatory Visit (HOSPITAL_COMMUNITY)
Admission: RE | Admit: 2022-04-27 | Discharge: 2022-04-27 | Disposition: A | Discharge status: Home / Self Care | Payer: PRIVATE HEALTH INSURANCE | Source: Ambulatory Visit | Attending: Nurse Practitioner | Admitting: Nurse Practitioner

## 2022-04-27 DIAGNOSIS — R0789 Other chest pain: Secondary | ICD-10-CM

## 2022-04-27 LAB — ECHOCARDIOGRAM COMPLETE
Area-P 1/2: 3.77 cm2
MV M vel: 5.34 m/s
MV Peak grad: 113.9 mmHg
S' Lateral: 2.9 cm

## 2022-04-27 NOTE — Progress Notes (Signed)
  Echocardiogram 2D Echocardiogram has been performed.  Susan Quinn 04/27/2022, 10:34 AM

## 2022-05-07 ENCOUNTER — Other Ambulatory Visit: Payer: Self-pay

## 2022-05-07 ENCOUNTER — Emergency Department (HOSPITAL_COMMUNITY): Payer: PRIVATE HEALTH INSURANCE

## 2022-05-07 ENCOUNTER — Emergency Department (HOSPITAL_COMMUNITY)
Admission: EM | Admit: 2022-05-07 | Discharge: 2022-05-07 | Disposition: A | Discharge status: Home / Self Care | Payer: PRIVATE HEALTH INSURANCE | Attending: Emergency Medicine | Admitting: Emergency Medicine

## 2022-05-07 ENCOUNTER — Encounter (HOSPITAL_COMMUNITY): Payer: Self-pay | Admitting: *Deleted

## 2022-05-07 DIAGNOSIS — E876 Hypokalemia: Secondary | ICD-10-CM | POA: Diagnosis not present

## 2022-05-07 DIAGNOSIS — Z79899 Other long term (current) drug therapy: Secondary | ICD-10-CM | POA: Diagnosis not present

## 2022-05-07 DIAGNOSIS — Z7984 Long term (current) use of oral hypoglycemic drugs: Secondary | ICD-10-CM | POA: Diagnosis not present

## 2022-05-07 DIAGNOSIS — I10 Essential (primary) hypertension: Secondary | ICD-10-CM | POA: Diagnosis not present

## 2022-05-07 DIAGNOSIS — R519 Headache, unspecified: Secondary | ICD-10-CM | POA: Diagnosis present

## 2022-05-07 LAB — URINALYSIS, ROUTINE W REFLEX MICROSCOPIC
Bilirubin Urine: NEGATIVE
Glucose, UA: NEGATIVE mg/dL
Hgb urine dipstick: NEGATIVE
Ketones, ur: NEGATIVE mg/dL
Leukocytes,Ua: NEGATIVE
Nitrite: NEGATIVE
Protein, ur: NEGATIVE mg/dL
Specific Gravity, Urine: 1.01 (ref 1.005–1.030)
pH: 6 (ref 5.0–8.0)

## 2022-05-07 LAB — CBC WITH DIFFERENTIAL/PLATELET
Abs Immature Granulocytes: 0.01 10*3/uL (ref 0.00–0.07)
Basophils Absolute: 0.1 10*3/uL (ref 0.0–0.1)
Basophils Relative: 1 %
Eosinophils Absolute: 0.1 10*3/uL (ref 0.0–0.5)
Eosinophils Relative: 2 %
HCT: 40.4 % (ref 36.0–46.0)
Hemoglobin: 12.6 g/dL (ref 12.0–15.0)
Immature Granulocytes: 0 %
Lymphocytes Relative: 35 %
Lymphs Abs: 2.2 10*3/uL (ref 0.7–4.0)
MCH: 24.3 pg — ABNORMAL LOW (ref 26.0–34.0)
MCHC: 31.2 g/dL (ref 30.0–36.0)
MCV: 78 fL — ABNORMAL LOW (ref 80.0–100.0)
Monocytes Absolute: 0.2 10*3/uL (ref 0.1–1.0)
Monocytes Relative: 3 %
Neutro Abs: 3.5 10*3/uL (ref 1.7–7.7)
Neutrophils Relative %: 59 %
Platelets: 236 10*3/uL (ref 150–400)
RBC: 5.18 MIL/uL — ABNORMAL HIGH (ref 3.87–5.11)
RDW: 14.4 % (ref 11.5–15.5)
WBC: 6.1 10*3/uL (ref 4.0–10.5)
nRBC: 0 % (ref 0.0–0.2)

## 2022-05-07 LAB — BASIC METABOLIC PANEL
Anion gap: 12 (ref 5–15)
BUN: 13 mg/dL (ref 6–20)
CO2: 30 mmol/L (ref 22–32)
Calcium: 8.1 mg/dL — ABNORMAL LOW (ref 8.9–10.3)
Chloride: 96 mmol/L — ABNORMAL LOW (ref 98–111)
Creatinine, Ser: 0.56 mg/dL (ref 0.44–1.00)
GFR, Estimated: >60 mL/min (ref >60)
Glucose, Bld: 91 mg/dL (ref 70–99)
Potassium: 2.4 mmol/L — CL (ref 3.5–5.1)
Sodium: 138 mmol/L (ref 135–145)

## 2022-05-07 LAB — MAGNESIUM: Magnesium: 0.8 mg/dL — CL (ref 1.7–2.4)

## 2022-05-07 MED ORDER — POTASSIUM CHLORIDE CRYS ER 20 MEQ PO TBCR: 20.0000 meq | EXTENDED_RELEASE_TABLET | Freq: Every day | ORAL | 0 refills | Status: AC

## 2022-05-07 MED ORDER — MAGNESIUM SULFATE 2 GM/50ML IV SOLN
2.0000 g | Freq: Once | INTRAVENOUS | Status: AC
  Administered 2022-05-07: 2 g via INTRAVENOUS
  Filled 2022-05-07: qty 50

## 2022-05-07 MED ORDER — POTASSIUM CHLORIDE CRYS ER 20 MEQ PO TBCR
40.0000 meq | EXTENDED_RELEASE_TABLET | Freq: Once | ORAL | Status: AC
  Administered 2022-05-07: 40 meq via ORAL
  Filled 2022-05-07: qty 2

## 2022-05-07 MED ORDER — MAGNESIUM OXIDE 250 MG PO TABS: 1.0000 | ORAL_TABLET | Freq: Every day | ORAL | 0 refills | Status: AC

## 2022-05-07 NOTE — ED Provider Notes (Signed)
[facility] Provider Note   CSN: [id number] Arrival date & time: 05/07/22  1652     History  Chief Complaint  Patient presents with   Hypertension    Susan Quinn is a 62 y.o. female.  She went to her doctor's office today to get her blood pressure checked and they found that it was high.  She has been feeling tingling in her body that is been going on since Sunday.  She also has a low-grade headache.  She has had the symptoms before and she attributes that to anxiety.  The nurse at the [facility] office recommended she come to the emergency department for evaluation because her blood pressure was 190/90.  She said she otherwise feels well and even played pickle ball yesterday.  No chest pain shortness of breath blurry vision double vision numbness weakness.  No difficulty with balance.  He has been taking her medications regularly but does endorse that she does not adhere to a low-salt diet.  The history is provided by the patient.  Hypertension This is a chronic problem. Associated symptoms include headaches. Pertinent negatives include no chest pain, no abdominal pain and no shortness of breath. Nothing aggravates the symptoms. Nothing relieves the symptoms. She has tried nothing for the symptoms. The treatment provided no relief.       Home Medications Prior to Admission medications   Medication Sig Start Date End Date Taking? Authorizing Provider  ALPRAZolam Duanne Moron) 0.25 MG tablet Take 0.25 mg by mouth daily as needed for anxiety. For anxiety 09/08/18   [provider]  baclofen (LIORESAL) 10 MG tablet TK 1 T PO TID PRF SEVERE PAIN 09/20/18   [provider]  Chlorzoxazone 750 MG TABS TK 1 T PO QID 09/29/18   [provider]  diazepam (VALIUM) 5 MG tablet Take 1 tablet (5 mg total) by mouth every 8 (eight) hours as needed for muscle spasms. 09/19/18   Evalee Jefferson, PA-C  diazepam (VALIUM) 5 MG tablet Take  one tablet one hour prior to procedure. May repeat as needed. Must have driver. 03/01/20   Marybelle Killings, MD  diclofenac (VOLTAREN) 75 MG EC tablet Take 1 tablet by mouth 2 (two) times daily as needed for mild pain.  09/12/15   [provider]  hydrochlorothiazide (HYDRODIURIL) 25 MG tablet TAKE 1 TABLET BY MOUTH EVERY DAY FOR BLOOD PRESSURE Patient taking differently: Take 25 mg by mouth daily.  02/09/13   Estill Dooms, NP  indomethacin (INDOCIN) 50 MG capsule TK 1 C PO TID 09/28/18   [provider]  Iron-FA-B Cmp-C-Biot-Probiotic (FUSION PLUS) CAPS Take 1 capsule by mouth daily. 09/09/18   [provider]  ketorolac (TORADOL) 10 MG tablet TK 1 T PO QID 09/22/18   [provider]  lisinopril (PRINIVIL,ZESTRIL) 10 MG tablet Take 10 mg by mouth daily.    [provider]  LUMIFY 0.025 % SOLN Place 1 drop into both eyes every 8 (eight) hours. 09/09/18   [provider]  metaxalone (SKELAXIN) 800 MG tablet TK 1 T PO QID 09/23/18   [provider]  metFORMIN (GLUCOPHAGE) 500 MG tablet Take 1,000 mg by mouth at bedtime.     [provider]  omeprazole (PRILOSEC OTC) 20 MG tablet Take 20 mg by mouth every morning.    [provider]      Allergies    Patient has no known allergies.    Review of Systems  Review of Systems  Constitutional:  Negative for fever.  HENT:  Negative for sore throat.   Respiratory:  Negative for shortness of breath.   Cardiovascular:  Negative for chest pain.  Gastrointestinal:  Negative for abdominal pain.  Genitourinary:  Negative for dysuria.  Skin:  Negative for rash.  Neurological:  Positive for headaches.    Physical Exam Updated Vital Signs BP (!) 181/90 (BP Location: Right Arm)   Pulse 90   Temp 98.4 F (36.9 C) (Oral)   Resp 14   SpO2 100%  Physical Exam Vitals and nursing note reviewed.  Constitutional:      General: She is not in acute distress.    Appearance: Normal  appearance. She is well-developed.  HENT:     Head: Normocephalic and atraumatic.  Eyes:     Conjunctiva/sclera: Conjunctivae normal.  Cardiovascular:     Rate and Rhythm: Normal rate and regular rhythm.     Heart sounds: No murmur heard. Pulmonary:     Effort: Pulmonary effort is normal. No respiratory distress.     Breath sounds: Normal breath sounds.  Abdominal:     Palpations: Abdomen is soft.     Tenderness: There is no abdominal tenderness. There is no guarding or rebound.  Musculoskeletal:        General: No swelling.     Cervical back: Neck supple.  Skin:    General: Skin is warm and dry.     Capillary Refill: Capillary refill takes less than 2 seconds.  Neurological:     General: No focal deficit present.     Mental Status: She is alert and oriented to person, place, and time.     Cranial Nerves: No cranial nerve deficit.     Sensory: No sensory deficit.     Motor: No weakness.     Gait: Gait normal.     ED Results / Procedures / Treatments   Labs (all labs ordered are listed, but only abnormal results are displayed) Labs Reviewed  BASIC METABOLIC PANEL - Abnormal; Notable for the following components:      Result Value   Potassium 2.4 (*)    Chloride 96 (*)    Calcium 8.1 (*)    All other components within normal limits  CBC WITH DIFFERENTIAL/PLATELET - Abnormal; Notable for the following components:   RBC 5.18 (*)    MCV 78.0 (*)    MCH 24.3 (*)    All other components within normal limits  URINALYSIS, ROUTINE W REFLEX MICROSCOPIC - Abnormal; Notable for the following components:   Color, Urine STRAW (*)    All other components within normal limits  MAGNESIUM - Abnormal; Notable for the following components:   Magnesium 0.8 (*)    All other components within normal limits    EKG EKG Interpretation  Date/Time:  Thursday May 07 2022 17:36:56 EST Ventricular Rate:  72 PR Interval:  148 QRS Duration: 100 QT Interval:  406 QTC  Calculation: 445 R Axis:   38 Text Interpretation: Sinus rhythm No old tracing to compare Confirmed by Aletta Edouard [id number]) on 05/07/2022 5:53:39 PM  Radiology CT Head Wo Contrast  Result Date: 05/07/2022 CLINICAL DATA:  Headache, hypertensive EXAM: CT HEAD WITHOUT CONTRAST TECHNIQUE: Contiguous axial images were obtained from the base of the skull through the vertex without intravenous contrast. RADIATION DOSE REDUCTION: This exam was performed according to the departmental dose-optimization program which includes automated exposure control, adjustment of the mA and/or kV according to patient size and/or  use of iterative reconstruction technique. COMPARISON:  None Available. FINDINGS: Brain: Evaluation is limited due to patient motion. No acute infarct or hemorrhage. Lateral ventricles and midline structures are unremarkable. No acute extra-axial fluid collections. No mass effect. Vascular: No hyperdense vessel or unexpected calcification. Skull: Normal. Negative for fracture or focal lesion. Sinuses/Orbits: No acute finding. Other: None. IMPRESSION: 1. Limited study due to patient motion. 2. No acute intracranial process. Electronically Signed   By: Randa Ngo M.D.   On: 05/07/2022 18:09    Procedures Procedures    Medications Ordered in ED Medications  potassium chloride SA (KLOR-CON M) CR tablet 40 mEq (40 mEq Oral Given 05/07/22 1901)  magnesium sulfate IVPB 2 g 50 mL (0 g Intravenous Stopped 05/07/22 2008)  magnesium sulfate IVPB 2 g 50 mL (0 g Intravenous Stopped 05/07/22 2111)    ED Course/ Medical Decision Making/ A&P Clinical Course as of 05/08/22 [id number]  Thu May 07, 2022  1824 Nurse now informed the patient confided her that she has been taking some Gummies just prior to this starting and this may be causing some of her symptoms. [MB]  2022 Exam significantly low at 0.8 and potassium 2.4.  EKG does not show any significant conduction abnormalities.  She is being repleted with oral  potassium and IV magnesium.  Will discharge on oral mag and potassium.  Recommended close follow-up with PCP for repeat lab work.  Return instructions discussed [MB]    Clinical Course User Index [MB] Hayden Rasmussen, MD                             Medical Decision Making Amount and/or Complexity of Data Reviewed Labs: ordered. Radiology: ordered.  Risk OTC drugs. Prescription drug management.   This patient complains of elevated blood pressure, feeling tingly; this involves an extensive number of treatment Options and is a complaint that carries with it a high risk of complications and morbidity. The differential includes hypertension, stroke, bleed, hypertensive urgency, metabolic derangement  I ordered, reviewed and interpreted labs, which included CBC normal, chemistries with markedly low potassium low magnesium normal renal function, urinalysis without signs of infection I ordered medication IV and oral potassium and magnesium and reviewed PMP when indicated. I ordered imaging studies which included head CT and I independently    visualized and interpreted imaging which showed no acute findings Additional history obtained from patient significant other Previous records obtained and reviewed in epic including PCP notes Cardiac monitoring reviewed, normal sinus rhythm Social determinants considered, no significant barriers Critical Interventions: None  After the interventions stated above, I reevaluated the patient and found patient to be very asymptomatic and blood pressure improved Admission and further testing considered, she could possibly benefit from admission for further treatment of her electrolyte disturbance.  However she feels very well and is asymptomatic and has been given some IV and oral repletion.  She is comfortable plan for continued oral repletion at home and close follow-up with PCP for repeat blood work.  Return instructions discussed.         Final  Clinical Impression(s) / ED Diagnoses Final diagnoses:  Primary hypertension  Hypokalemia  Hypomagnesemia    Rx / DC Orders ED Discharge Orders          Ordered    Magnesium Oxide 250 MG TABS  Daily        05/07/22 1952    potassium chloride SA (KLOR-CON M) 20  MEQ tablet  Daily        05/07/22 1952              Hayden Rasmussen, MD 05/08/22 [phone]

## 2022-05-07 NOTE — Discharge Instructions (Signed)
You were seen in the emergency department for elevated blood pressure and feeling strange.  Your blood pressure improved while you were here.  Your lab work showed your potassium and magnesium were very low.  We have given you some of each and will need to continue potassium and magnesium and have your doctor repeat lab work early next week.  Return to the emergency department if any worsening or concerning symptoms.

## 2022-05-07 NOTE — ED Triage Notes (Signed)
Pt was seen by her PCP for HA and feeling of tingling in her head and feeling a heaviness in her body.  PCP found her to be hypertensive in 190's.  No CP or SOB.  Pt is on BP meds and taking as prescribed.

## 2022-05-07 NOTE — ED Notes (Signed)
Pt up to restroom, ambulated with normal gait.

## 2022-05-07 NOTE — ED Notes (Signed)
Patient transported to CT 

## 2022-05-28 ENCOUNTER — Encounter: Payer: Self-pay | Admitting: Radiology

## 2023-02-23 IMAGING — MG MM DIGITAL SCREENING BILAT W/ TOMO AND CAD
8 series · 8 of 24 positions shown · non-contrast
Comparison: Previous exam(s).

CLINICAL DATA: Screening.

EXAM:
DIGITAL SCREENING BILATERAL MAMMOGRAM WITH TOMOSYNTHESIS AND CAD
TECHNIQUE: Bilateral screening digital craniocaudal and mediolateral oblique
mammograms were obtained. Bilateral screening digital breast
tomosynthesis was performed. The images were evaluated with
computer-aided detection.

[R MLO synth-2D]
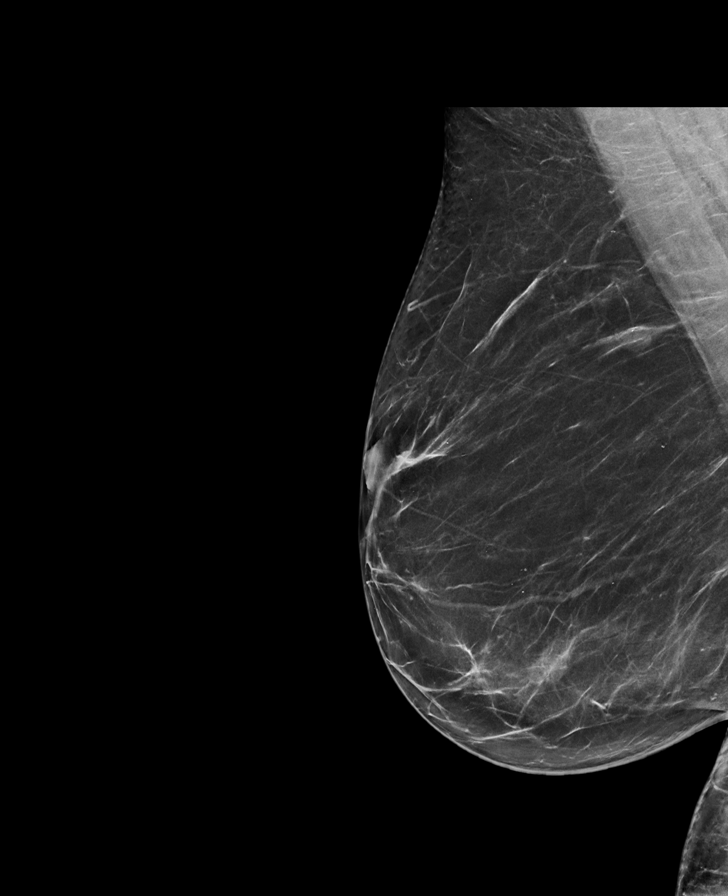

[R CC synth-2D]
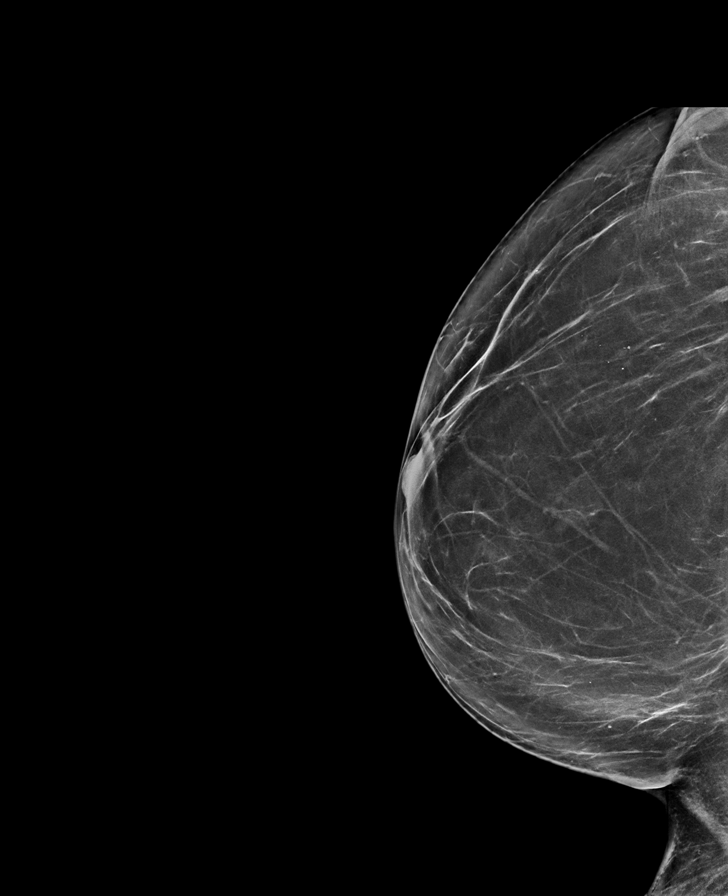

[L MLO synth-2D]
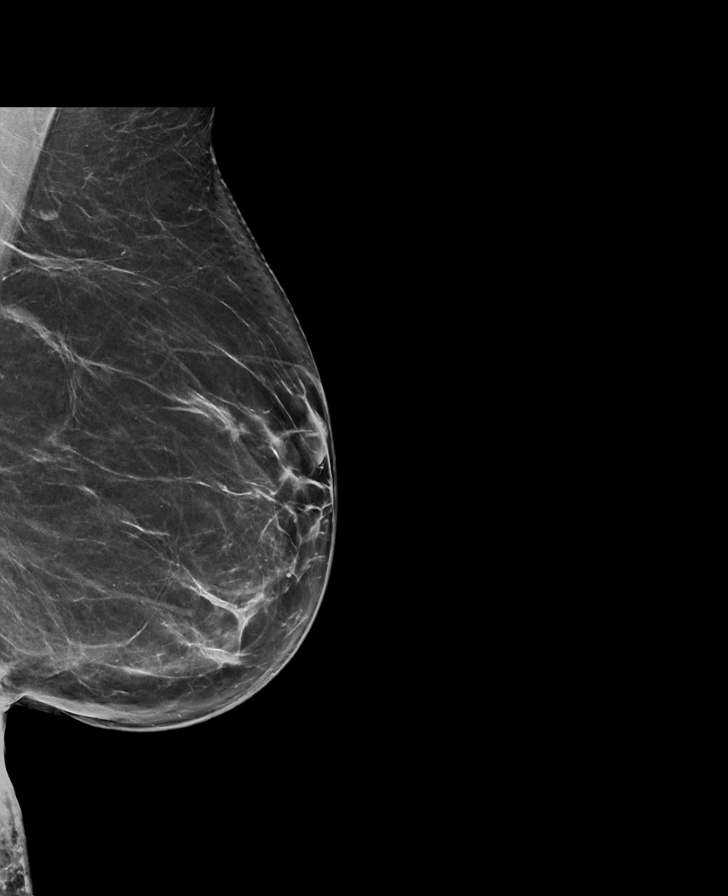

[L CC synth-2D]
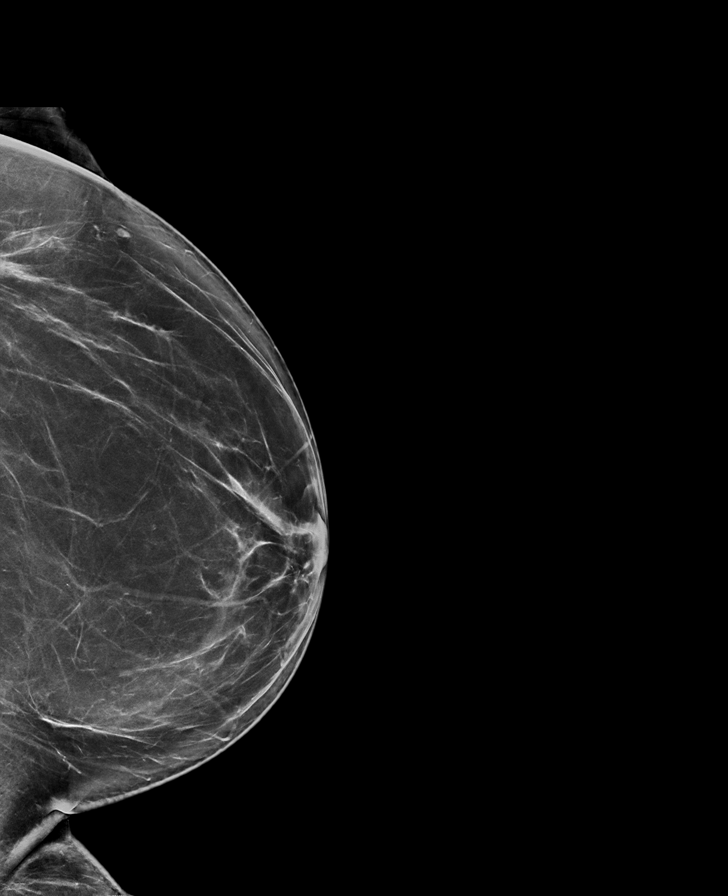

[R CC tomo · tomo slice 39/78.0]
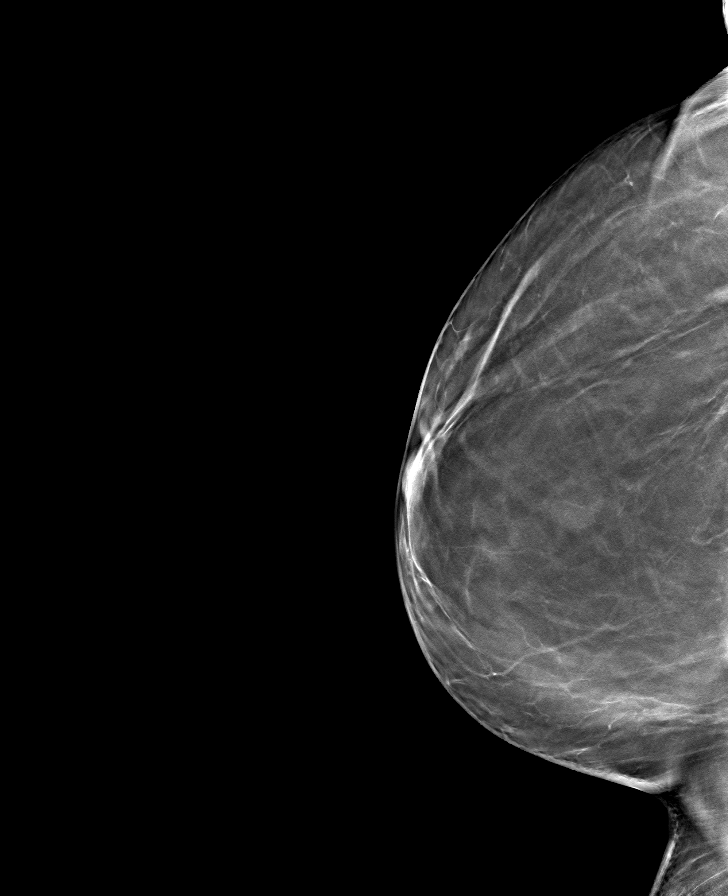

[L MLO tomo · tomo slice 40/79.0]
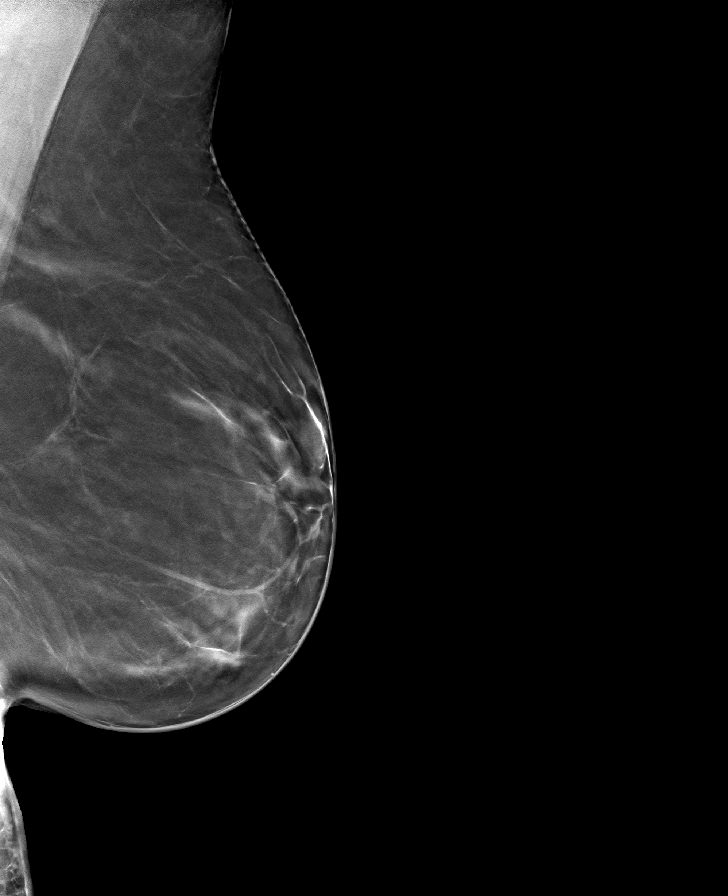

[L CC tomo · tomo slice 42/83.0]
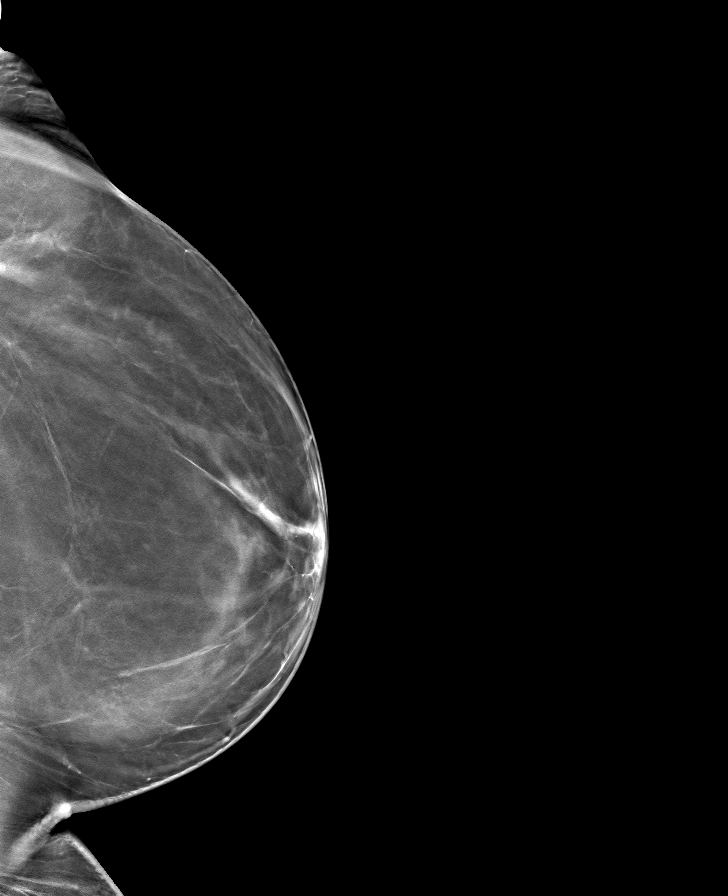

[R MLO tomo · tomo slice 41/82.0]
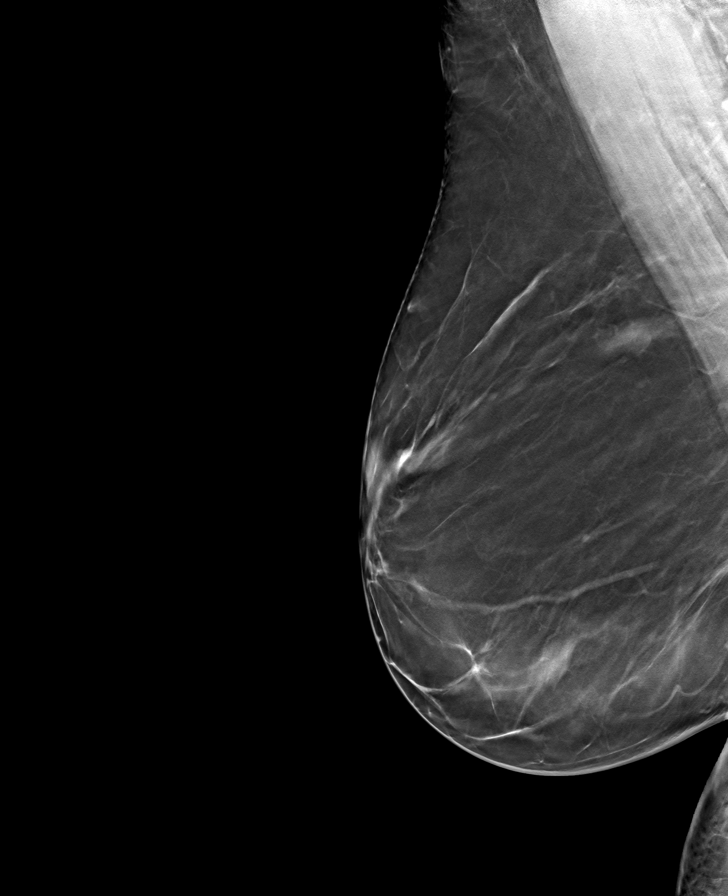

[8 of 24 positions shown; findings below may reference images not displayed]

ACR Breast Density Category b: There are scattered areas of
fibroglandular density.
FINDINGS: There are no findings suspicious for malignancy.
IMPRESSION: No mammographic evidence of malignancy. A result letter of this
screening mammogram will be mailed directly to the patient.

RECOMMENDATION:
Screening mammogram in one year. (Code:51-O-LD2)

BI-RADS CATEGORY  1: Negative.

## 2023-03-12 ENCOUNTER — Other Ambulatory Visit: Payer: Self-pay | Admitting: Internal Medicine

## 2023-03-12 DIAGNOSIS — Z1231 Encounter for screening mammogram for malignant neoplasm of breast: Secondary | ICD-10-CM

## 2023-04-08 ENCOUNTER — Ambulatory Visit
Admission: RE | Admit: 2023-04-08 | Discharge: 2023-04-08 | Disposition: A | Discharge status: Home / Self Care | Payer: 59 | Source: Ambulatory Visit | Attending: Internal Medicine | Admitting: Internal Medicine

## 2023-04-08 DIAGNOSIS — Z1231 Encounter for screening mammogram for malignant neoplasm of breast: Secondary | ICD-10-CM

## 2023-05-24 ENCOUNTER — Emergency Department (HOSPITAL_COMMUNITY)
Admission: EM | Admit: 2023-05-24 | Discharge: 2023-05-24 | Disposition: A | Discharge status: Home / Self Care | Payer: 59 | Attending: Emergency Medicine | Admitting: Emergency Medicine

## 2023-05-24 ENCOUNTER — Encounter (HOSPITAL_COMMUNITY): Payer: Self-pay | Admitting: Emergency Medicine

## 2023-05-24 ENCOUNTER — Other Ambulatory Visit: Payer: Self-pay

## 2023-05-24 DIAGNOSIS — Z7984 Long term (current) use of oral hypoglycemic drugs: Secondary | ICD-10-CM | POA: Insufficient documentation

## 2023-05-24 DIAGNOSIS — I10 Essential (primary) hypertension: Secondary | ICD-10-CM | POA: Insufficient documentation

## 2023-05-24 DIAGNOSIS — Z79899 Other long term (current) drug therapy: Secondary | ICD-10-CM | POA: Diagnosis not present

## 2023-05-24 DIAGNOSIS — E876 Hypokalemia: Secondary | ICD-10-CM | POA: Insufficient documentation

## 2023-05-24 LAB — CBC WITH DIFFERENTIAL/PLATELET
Abs Immature Granulocytes: 0.01 10*3/uL (ref 0.00–0.07)
Basophils Absolute: 0 10*3/uL (ref 0.0–0.1)
Basophils Relative: 1 %
Eosinophils Absolute: 0.1 10*3/uL (ref 0.0–0.5)
Eosinophils Relative: 1 %
HCT: 34.5 % — ABNORMAL LOW (ref 36.0–46.0)
Hemoglobin: 10.5 g/dL — ABNORMAL LOW (ref 12.0–15.0)
Immature Granulocytes: 0 %
Lymphocytes Relative: 33 %
Lymphs Abs: 1.8 10*3/uL (ref 0.7–4.0)
MCH: 23.8 pg — ABNORMAL LOW (ref 26.0–34.0)
MCHC: 30.4 g/dL (ref 30.0–36.0)
MCV: 78.1 fL — ABNORMAL LOW (ref 80.0–100.0)
Monocytes Absolute: 0.3 10*3/uL (ref 0.1–1.0)
Monocytes Relative: 5 %
Neutro Abs: 3.3 10*3/uL (ref 1.7–7.7)
Neutrophils Relative %: 60 %
Platelets: 235 10*3/uL (ref 150–400)
RBC: 4.42 MIL/uL (ref 3.87–5.11)
RDW: 15.9 % — ABNORMAL HIGH (ref 11.5–15.5)
WBC: 5.5 10*3/uL (ref 4.0–10.5)
nRBC: 0 % (ref 0.0–0.2)

## 2023-05-24 LAB — COMPREHENSIVE METABOLIC PANEL
ALT: 17 U/L (ref 0–44)
AST: 20 U/L (ref 15–41)
Albumin: 3.9 g/dL (ref 3.5–5.0)
Alkaline Phosphatase: 53 U/L (ref 38–126)
Anion gap: 11 (ref 5–15)
BUN: 10 mg/dL (ref 8–23)
CO2: 31 mmol/L (ref 22–32)
Calcium: 7.7 mg/dL — ABNORMAL LOW (ref 8.9–10.3)
Chloride: 99 mmol/L (ref 98–111)
Creatinine, Ser: 0.47 mg/dL (ref 0.44–1.00)
GFR, Estimated: >60 mL/min (ref >60)
Glucose, Bld: 91 mg/dL (ref 70–99)
Potassium: 3.2 mmol/L — ABNORMAL LOW (ref 3.5–5.1)
Sodium: 141 mmol/L (ref 135–145)
Total Bilirubin: 0.5 mg/dL (ref 0.0–1.2)
Total Protein: 6.8 g/dL (ref 6.5–8.1)

## 2023-05-24 LAB — MAGNESIUM: Magnesium: 1 mg/dL — ABNORMAL LOW (ref 1.7–2.4)

## 2023-05-24 MED ORDER — LISINOPRIL 10 MG PO TABS
10.0000 mg | ORAL_TABLET | Freq: Once | ORAL | Status: AC
  Administered 2023-05-24: 10 mg via ORAL
  Filled 2023-05-24: qty 1

## 2023-05-24 MED ORDER — MAGNESIUM SULFATE 2 GM/50ML IV SOLN
2.0000 g | Freq: Once | INTRAVENOUS | Status: AC
  Administered 2023-05-24: 2 g via INTRAVENOUS
  Filled 2023-05-24: qty 50

## 2023-05-24 MED ORDER — POTASSIUM CHLORIDE CRYS ER 20 MEQ PO TBCR
40.0000 meq | EXTENDED_RELEASE_TABLET | Freq: Once | ORAL | Status: AC
  Administered 2023-05-24: 40 meq via ORAL
  Filled 2023-05-24: qty 2

## 2023-05-24 NOTE — ED Triage Notes (Signed)
 Pt reports a week long history of tingling in her hand, stiffness all over her body, and feeling that her hands don't work.  Hx of same she reports and her magnesium and potassium were low.

## 2023-05-24 NOTE — ED Notes (Signed)
 Pt ambulated to restroom.

## 2023-05-24 NOTE — Discharge Instructions (Signed)
 See your Physician on Wednesday for repeat labs.  Hydrochlorothiazide and Omeprazole may be the cause of your magnesium being low.  Ask your Physician about changing your medication

## 2023-05-24 NOTE — ED Provider Triage Note (Signed)
 Emergency Medicine Provider Triage Evaluation Note  Susan Quinn , a 63 y.o. female  was evaluated in triage.  Pt complains of tingling in her body and hands feel weak.  Pt has had similar when her potassium and magnesium were low  Review of Systems  Positive: tingling Negative: No fever, no pain   Physical Exam  BP 115/89 (BP Location: Right Arm)   Pulse 72   Temp 98.4 F (36.9 C) (Oral)   Resp 18   SpO2 97%  Gen:   Awake, no distress   Resp:  Normal effort  MSK:   Moves extremities without difficulty  Other:   Medical Decision Making  Medically screening exam initiated at 3:37 PM.  Appropriate orders placed.  Susan Quinn was informed that the remainder of the evaluation will be completed by another provider, this initial triage assessment does not replace that evaluation, and the importance of remaining in the ED until their evaluation is complete.     Elson Areas, [place] 05/24/23 1539

## 2023-05-24 NOTE — ED Provider Notes (Signed)
 [place] EMERGENCY DEPARTMENT AT [facility] Provider Note   CSN: [id number] Arrival date & time: 05/24/23  1517     History  Chief Complaint  Patient presents with   Tingling    Susan Quinn is a 63 y.o. female.  Patient complains of feeling like her magnesium is low.  Patient reports that she has a past medical history of low magnesium and low potassium.  Patient reports that she was driving today and noticed that her hands feel tingly.  Patient reports that she had a similar episode about a year ago and her potassium and magnesium were low.  Patient states this feels the same way.  Patient reports that she had to have IV potassium and IV magnesium.  Patient denies any chest pain.  She denies any abdominal pain.  Patient has not had any fever or chills.  Patient is not having any weakness.  The history is provided by the patient. No language interpreter was used.       Home Medications Prior to Admission medications   Medication Sig Start Date End Date Taking? Authorizing Provider  ALPRAZolam Prudy Feeler) 0.25 MG tablet Take 0.25 mg by mouth daily as needed for anxiety. For anxiety 09/08/18   [provider]  atorvastatin (LIPITOR) 10 MG tablet Take 10 mg by mouth 3 (three) times a week. Friday,Saturday and Sunday    [provider]  Cholecalciferol (VITAMIN D-3 PO) Take 2 tablets by mouth daily.    [provider]  hydrochlorothiazide (HYDRODIURIL) 25 MG tablet TAKE 1 TABLET BY MOUTH EVERY DAY FOR BLOOD PRESSURE Patient taking differently: Take 25 mg by mouth daily. 02/09/13   Adline Potter, NP  Iron-FA-B Cmp-C-Biot-Probiotic (FUSION PLUS) CAPS Take 1 capsule by mouth daily. 09/09/18   [provider]  lisinopril (PRINIVIL,ZESTRIL) 10 MG tablet Take 10 mg by mouth daily.    [provider]  LUMIFY 0.025 % SOLN Place 1 drop into both eyes every 8 (eight) hours. 09/09/18   [provider]  Magnesium Oxide  250 MG TABS Take 1 tablet (250 mg total) by mouth daily. 05/07/22   Terrilee Files, MD  meloxicam (MOBIC) 7.5 MG tablet Take 15 mg by mouth daily. 10/21/21   [provider]  metFORMIN (GLUCOPHAGE) 500 MG tablet Take 1,000 mg by mouth at bedtime.     [provider]  omeprazole (PRILOSEC OTC) 20 MG tablet Take 20 mg by mouth every morning.    [provider]  OZEMPIC, 2 MG/DOSE, 8 MG/3ML SOPN Inject 2 mg into the skin once a week. 05/04/22   [provider]  potassium chloride SA (KLOR-CON M) 20 MEQ tablet Take 1 tablet (20 mEq total) by mouth daily. 05/07/22   Terrilee Files, MD  sertraline (ZOLOFT) 25 MG tablet Take 25 mg by mouth at bedtime. 04/17/22   [provider]      Allergies    Patient has no known allergies.    Review of Systems   Review of Systems  All other systems reviewed and are negative.   Physical Exam Updated Vital Signs BP (!) 178/87 (BP Location: Right Arm)   Pulse 64   Temp 98.5 F (36.9 C) (Oral)   Resp 16   SpO2 94%  Physical Exam Vitals and nursing note reviewed.  Constitutional:      Appearance: She is well-developed.  HENT:     Head: Normocephalic.  Cardiovascular:     Rate and Rhythm: Normal rate.  Pulmonary:     Effort: Pulmonary effort is normal.  Abdominal:     General: There is no distension.  Musculoskeletal:        General: Normal range of motion.     Cervical back: Normal range of motion.  Skin:    General: Skin is warm.  Neurological:     General: No focal deficit present.     Mental Status: She is alert and oriented to person, place, and time.     ED Results / Procedures / Treatments   Labs (all labs ordered are listed, but only abnormal results are displayed) Labs Reviewed  CBC WITH DIFFERENTIAL/PLATELET - Abnormal; Notable for the following components:      Result Value   Hemoglobin 10.5 (*)    HCT 34.5 (*)    MCV 78.1 (*)    MCH 23.8 (*)    RDW 15.9 (*)    All other  components within normal limits  COMPREHENSIVE METABOLIC PANEL - Abnormal; Notable for the following components:   Potassium 3.2 (*)    Calcium 7.7 (*)    All other components within normal limits  MAGNESIUM - Abnormal; Notable for the following components:   Magnesium 1.0 (*)    All other components within normal limits    EKG None  Radiology No results found.  Procedures Procedures    Medications Ordered in ED Medications  magnesium sulfate IVPB 2 g 50 mL (0 g Intravenous Stopped 05/24/23 1939)  potassium chloride SA (KLOR-CON M) CR tablet 40 mEq (40 mEq Oral Given 05/24/23 1837)  lisinopril (ZESTRIL) tablet 10 mg (10 mg Oral Given 05/24/23 2020)    ED Course/ Medical Decision Making/ A&P Clinical Course as of 05/24/23 2337  Mon May 24, 2023  1658 Comprehensive metabolic panel(!) [LS]  1738 Magnesium(!): 1.0 [LS-2]    Clinical Course User Index [LS] Elson Areas, PA-C [LS-2] Meade Maw, [place]ighbor                                 Medical Decision Making Patient concerned that her magnesium and potassium are low  Amount and/or Complexity of Data Reviewed External Data Reviewed: notes.    Details: Previous ED notes reviewed showing low magnesium and potassium in February 2024 Labs: ordered. Decision-making details documented in ED Course.    Details: Labs ordered reviewed and interpreted.  Potassium is 3.2 magnesium is 1.  Risk Prescription drug management. Risk Details: Patient given IV magnesium.  Patient given oral potassium 40 mEq.  Patient is scheduled to see her primary care physician for recheck on Wednesday I advised her to have repeat laboratory evaluations.  Patient is counseled on medications that could cause hypomagnesium and hypokalemia.  Patient is advised to discuss hydrochlorothiazide and omeprazole with her physician.           Final Clinical Impression(s) / ED Diagnoses Final diagnoses:  Hypomagnesemia  Hypokalemia  Hypertension,  unspecified type    Rx / DC Orders ED Discharge Orders     None     An After Visit Summary was printed and given to the patient.     Elson Areas, PA-C 05/24/23 2341    Derwood Kaplan, MD 05/25/23 [id number]

## 2023-07-11 IMAGING — DX DG ABDOMEN 1V
2 series · 2 of 2 positions shown · non-contrast
Comparison: 07/19/2007

CLINICAL DATA: Nausea, vomiting

EXAM:
ABDOMEN - 1 VIEW

[abdomen kub (1 of 2)]
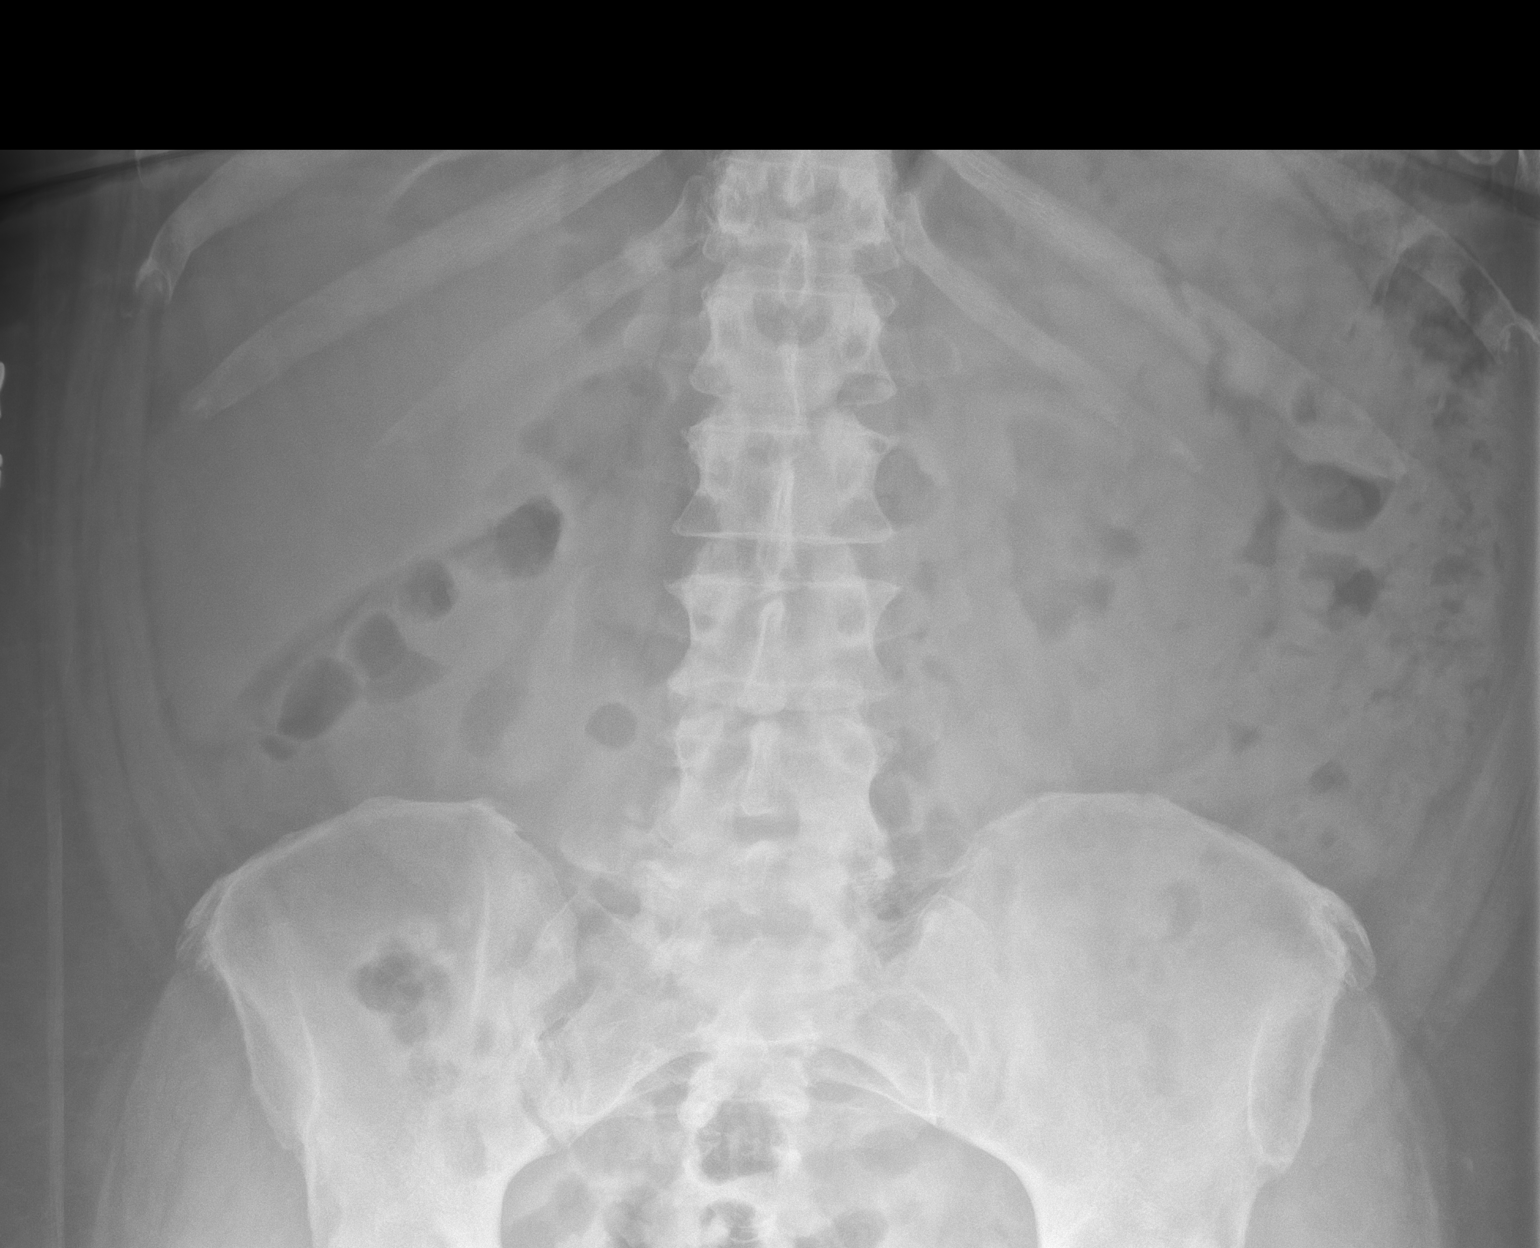

[abdomen kub (2 of 2)]
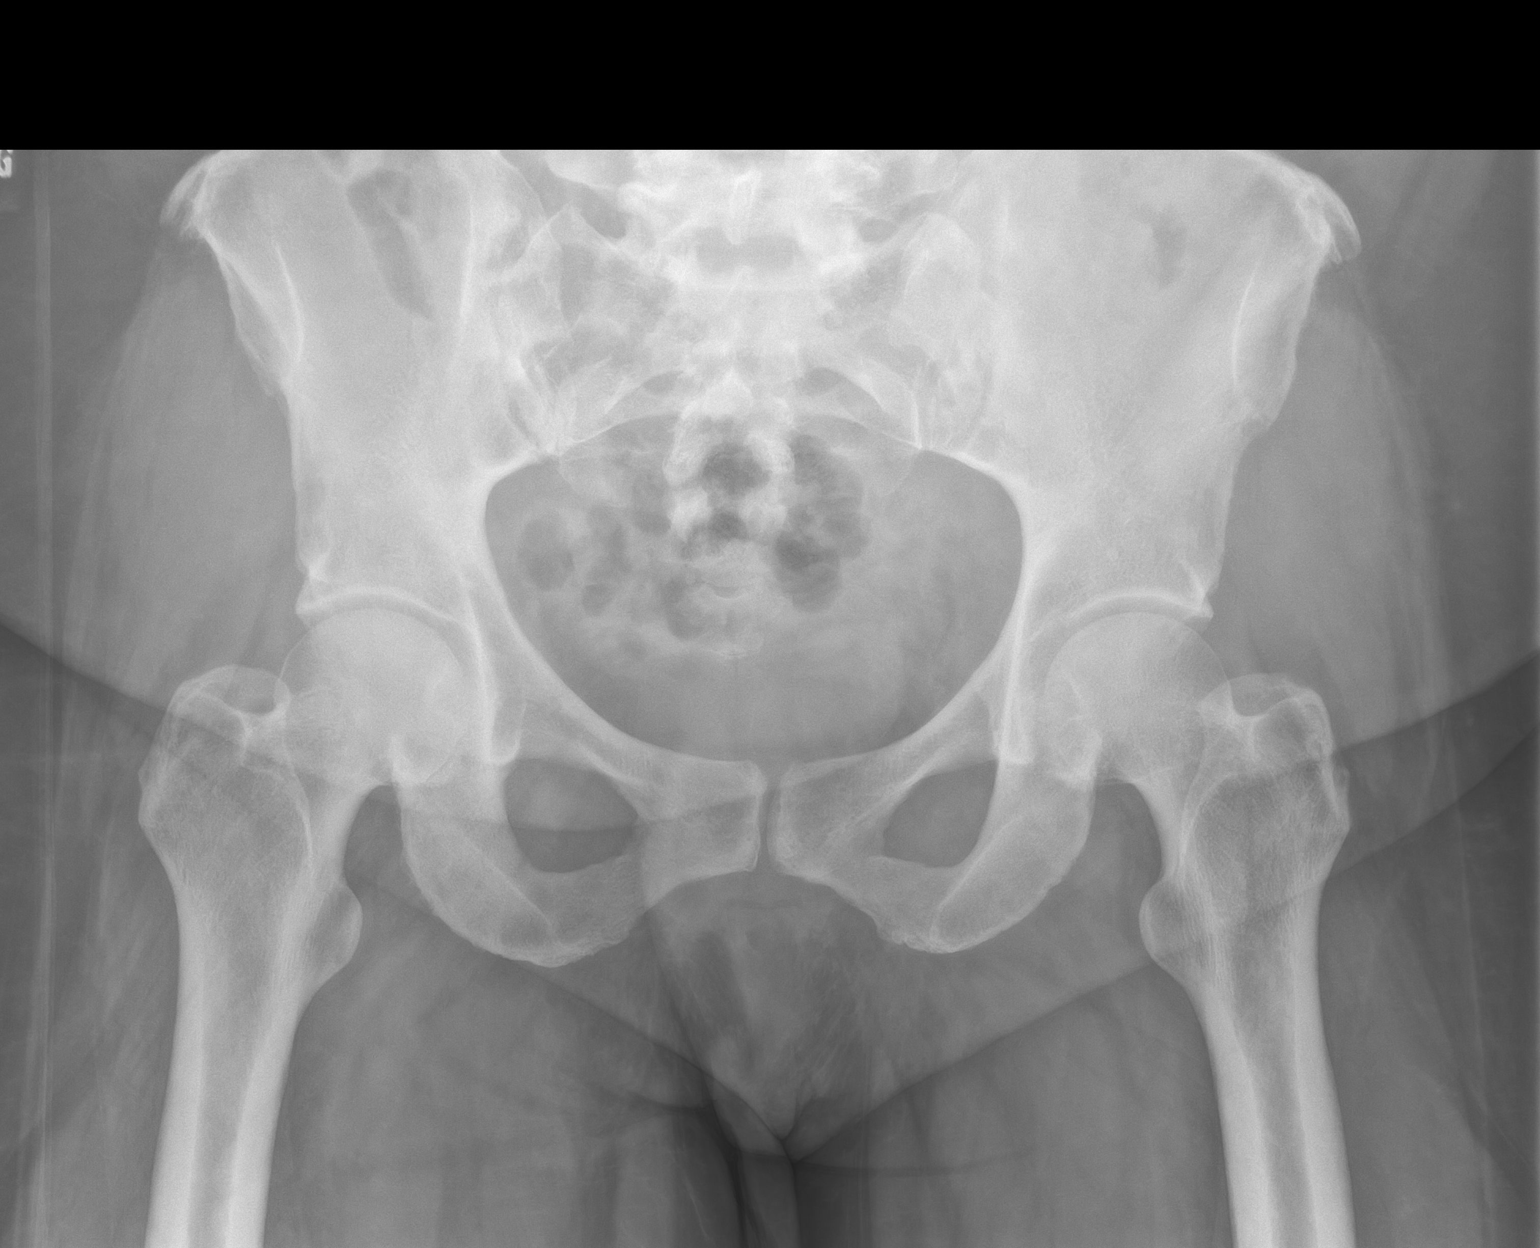

[2 of 2 positions shown; findings below may reference images not displayed]

FINDINGS: Bowel gas pattern is nonspecific. There is small amount of stool in
colon. There is no fecal impaction in the rectum. No abnormal masses
or calcifications are seen.
IMPRESSION: Nonspecific bowel gas pattern.

## 2023-10-29 DIAGNOSIS — M25431 Effusion, right wrist: Secondary | ICD-10-CM | POA: Diagnosis not present

## 2023-10-29 DIAGNOSIS — M1712 Unilateral primary osteoarthritis, left knee: Secondary | ICD-10-CM | POA: Diagnosis not present

## 2023-10-29 DIAGNOSIS — M25641 Stiffness of right hand, not elsewhere classified: Secondary | ICD-10-CM | POA: Diagnosis not present

## 2023-10-29 DIAGNOSIS — M7662 Achilles tendinitis, left leg: Secondary | ICD-10-CM | POA: Diagnosis not present

## 2023-10-29 DIAGNOSIS — M7752 Other enthesopathy of left foot: Secondary | ICD-10-CM | POA: Diagnosis not present

## 2023-11-30 DIAGNOSIS — M25531 Pain in right wrist: Secondary | ICD-10-CM | POA: Diagnosis not present

## 2023-11-30 DIAGNOSIS — G8929 Other chronic pain: Secondary | ICD-10-CM | POA: Diagnosis not present

## 2023-12-02 ENCOUNTER — Other Ambulatory Visit (HOSPITAL_COMMUNITY): Payer: Self-pay | Admitting: Sports Medicine

## 2023-12-02 ENCOUNTER — Encounter (HOSPITAL_COMMUNITY): Payer: Self-pay | Admitting: Orthopedic Surgery

## 2023-12-02 ENCOUNTER — Other Ambulatory Visit (HOSPITAL_COMMUNITY): Payer: Self-pay | Admitting: Orthopedic Surgery

## 2023-12-02 DIAGNOSIS — E876 Hypokalemia: Secondary | ICD-10-CM | POA: Diagnosis not present

## 2023-12-02 DIAGNOSIS — M25531 Pain in right wrist: Secondary | ICD-10-CM

## 2023-12-02 DIAGNOSIS — E669 Obesity, unspecified: Secondary | ICD-10-CM | POA: Diagnosis not present

## 2023-12-02 DIAGNOSIS — I10 Essential (primary) hypertension: Secondary | ICD-10-CM | POA: Diagnosis not present

## 2023-12-06 ENCOUNTER — Encounter: Payer: Self-pay | Admitting: Radiology

## 2023-12-06 ENCOUNTER — Inpatient Hospital Stay
Admission: RE | Admit: 2023-12-06 | Discharge: 2023-12-06 | Disposition: A | Discharge status: Home / Self Care | Payer: Self-pay | Source: Ambulatory Visit | Attending: Orthopedic Surgery | Admitting: Orthopedic Surgery

## 2023-12-06 ENCOUNTER — Other Ambulatory Visit: Payer: Self-pay | Admitting: Orthopedic Surgery

## 2023-12-06 DIAGNOSIS — M25531 Pain in right wrist: Secondary | ICD-10-CM

## 2023-12-06 NOTE — Progress Notes (Signed)
 Message has been sent between Dr Karalee and Dr Philip to determine best way to proceed.     Philip Cornet, MD  Susan Quinn, RT Schedule for US  guided wrist aspiration, local anesthetic only.  Cannot see [facility] images. Will look for fluid and aspirate if we see any.  Try to schedule with me if possible.  Henn       Previous Messages    ----- Message ----- From: Susan Quinn, RT Sent: 12/06/2023  11:19 AM EDT To: Susan Quinn; Susan Quinn, RT; Ir Proc* Subject: DG FL ASP/INJ INTER (WRIST, ELBOW, ANKLE)      Procedure : DG FL ASP/INJ INTER (WRIST, ELBOW, ANKLE)  Reason : rt wrist pain Dx: Right wrist pain [M25.531 (ICD-10-CM)]    History : PT's Imaging was done at [facility]. Office is unable to get imaging from them. Was asked to see if you can view images from [facility]?  Provider: Delene Marsa HERO, MD  Provider contact ;  [phone]

## 2023-12-06 NOTE — Progress Notes (Signed)
 Karalee Wilkie POUR, MD  Daralene Ferol FALCON, RT; Carlie Clarita RAMAN You can schedule for IR - we can either aspirate with US  or fluoro depending on what we see.  HKM       Previous Messages    ----- Message ----- From: Daralene Ferol FALCON, RT Sent: 12/06/2023  12:47 PM EDT To: Wilkie POUR Karalee, MD Subject: RE: DG FL ASP/INJ INTER (WRIST, ELBOW, ANKLE)  The order states Fluoro Guide needle placement - Aspirate R wrist cyst ----- Message ----- From: Karalee Wilkie POUR, MD Sent: 12/06/2023  12:42 PM EDT To: Ferol FALCON Daralene, RT Subject: RE: DG FL ASP/INJ INTER (WRIST, ELBOW, ANKLE)  We cannot see any images from [facility], but I can see their reports.  It looks like she had a diagnostic aspiration of the wrist joint there.  What is the office wanting to request?  Is this an MR Arthrogram, a repeat aspiration or are they looking to order a therapeutic steroid injection?    HKM ----- Message ----- From: Daralene Ferol FALCON, RT Sent: 12/06/2023  11:19 AM EDT To: Channing LITTIE Eagles; Ethelreda Sukhu F Tamber Burtch, RT; Ir Proc* Subject: DG FL ASP/INJ INTER (WRIST, ELBOW, ANKLE)      Procedure : DG FL ASP/INJ INTER (WRIST, ELBOW, ANKLE)  Reason : rt wrist pain Dx: Right wrist pain [M25.531 (ICD-10-CM)]    History : PT's Imaging was done at [facility]. Office is unable to get imaging from them. Was asked to see if you can view images from [facility]?  Provider: Delene Marsa HERO, MD  Provider contact ;  [phone]

## 2024-01-05 DIAGNOSIS — Z23 Encounter for immunization: Secondary | ICD-10-CM | POA: Diagnosis not present

## 2024-01-10 ENCOUNTER — Other Ambulatory Visit (HOSPITAL_COMMUNITY): Payer: Self-pay | Admitting: Orthopedic Surgery

## 2024-01-10 ENCOUNTER — Ambulatory Visit (HOSPITAL_COMMUNITY)
Admission: RE | Admit: 2024-01-10 | Discharge: 2024-01-10 | Disposition: A | Discharge status: Home / Self Care | Payer: Self-pay | Source: Ambulatory Visit | Attending: Orthopedic Surgery | Admitting: Orthopedic Surgery

## 2024-01-10 DIAGNOSIS — M25531 Pain in right wrist: Secondary | ICD-10-CM | POA: Insufficient documentation

## 2024-01-10 DIAGNOSIS — G8929 Other chronic pain: Secondary | ICD-10-CM | POA: Diagnosis not present

## 2024-01-10 NOTE — Procedures (Signed)
 Interventional Radiology Procedure:   Indications: Right wrist effusion  Procedure: Ultrasound evaluation of right wrist  Findings: No effusion identified.    Complications: None     EBL: None  Plan:  Aspiration not performed.    Avana Kreiser R. Philip, MD  Pager: [phone]
